# Patient Record
Sex: Male | Born: 1970 | Race: White | Hispanic: No | Smoking: Never smoker
Health system: Southern US, Community
[De-identification: ages and names within clinical notes are randomized; demographics above are authoritative.]

## PROBLEM LIST (undated history)

## (undated) DIAGNOSIS — K219 Gastro-esophageal reflux disease without esophagitis: Secondary | ICD-10-CM

## (undated) DIAGNOSIS — J45909 Unspecified asthma, uncomplicated: Secondary | ICD-10-CM

## (undated) DIAGNOSIS — G4733 Obstructive sleep apnea (adult) (pediatric): Secondary | ICD-10-CM

## (undated) DIAGNOSIS — R7989 Other specified abnormal findings of blood chemistry: Secondary | ICD-10-CM

## (undated) DIAGNOSIS — R251 Tremor, unspecified: Secondary | ICD-10-CM

## (undated) DIAGNOSIS — E119 Type 2 diabetes mellitus without complications: Secondary | ICD-10-CM

## (undated) DIAGNOSIS — F32A Depression, unspecified: Secondary | ICD-10-CM

## (undated) DIAGNOSIS — I10 Essential (primary) hypertension: Secondary | ICD-10-CM

## (undated) DIAGNOSIS — E669 Obesity, unspecified: Secondary | ICD-10-CM

## (undated) DIAGNOSIS — I82409 Acute embolism and thrombosis of unspecified deep veins of unspecified lower extremity: Secondary | ICD-10-CM

## (undated) DIAGNOSIS — F419 Anxiety disorder, unspecified: Secondary | ICD-10-CM

## (undated) DIAGNOSIS — D689 Coagulation defect, unspecified: Secondary | ICD-10-CM

## (undated) DIAGNOSIS — F319 Bipolar disorder, unspecified: Secondary | ICD-10-CM

## (undated) DIAGNOSIS — M48061 Spinal stenosis, lumbar region without neurogenic claudication: Secondary | ICD-10-CM

## (undated) DIAGNOSIS — G4700 Insomnia, unspecified: Secondary | ICD-10-CM

## (undated) DIAGNOSIS — M1990 Unspecified osteoarthritis, unspecified site: Secondary | ICD-10-CM

## (undated) DIAGNOSIS — E559 Vitamin D deficiency, unspecified: Secondary | ICD-10-CM

## (undated) DIAGNOSIS — E785 Hyperlipidemia, unspecified: Secondary | ICD-10-CM

## (undated) DIAGNOSIS — N529 Male erectile dysfunction, unspecified: Secondary | ICD-10-CM

## (undated) DIAGNOSIS — I2699 Other pulmonary embolism without acute cor pulmonale: Secondary | ICD-10-CM

## (undated) HISTORY — DX: Tremor, unspecified: R25.1

## (undated) HISTORY — DX: Essential (primary) hypertension: I10

## (undated) HISTORY — PX: CHOLECYSTECTOMY: SHX55

## (undated) HISTORY — DX: Obesity, unspecified: E66.9

## (undated) HISTORY — DX: Obstructive sleep apnea (adult) (pediatric): G47.33

## (undated) HISTORY — DX: Vitamin D deficiency, unspecified: E55.9

## (undated) HISTORY — PX: NASAL SINUS SURGERY: SHX719

## (undated) HISTORY — DX: Depression, unspecified: F32.A

## (undated) HISTORY — DX: Acute embolism and thrombosis of unspecified deep veins of unspecified lower extremity: I82.409

## (undated) HISTORY — DX: Male erectile dysfunction, unspecified: N52.9

## (undated) HISTORY — DX: Other pulmonary embolism without acute cor pulmonale: I26.99

## (undated) HISTORY — DX: Bipolar disorder, unspecified: F31.9

## (undated) HISTORY — DX: Coagulation defect, unspecified: D68.9

## (undated) HISTORY — DX: Unspecified osteoarthritis, unspecified site: M19.90

## (undated) HISTORY — DX: Other specified abnormal findings of blood chemistry: R79.89

## (undated) HISTORY — DX: Hyperlipidemia, unspecified: E78.5

## (undated) HISTORY — DX: Anxiety disorder, unspecified: F41.9

## (undated) HISTORY — DX: Insomnia, unspecified: G47.00

## (undated) HISTORY — DX: Gastro-esophageal reflux disease without esophagitis: K21.9

## (undated) HISTORY — DX: Spinal stenosis, lumbar region without neurogenic claudication: M48.061

## (undated) HISTORY — PX: COLONOSCOPY: SHX174

## (undated) HISTORY — DX: Unspecified asthma, uncomplicated: J45.909

---

## 2006-02-14 ENCOUNTER — Inpatient Hospital Stay (HOSPITAL_COMMUNITY): Admission: EM | Admit: 2006-02-14 | Discharge: 2006-02-17 | Payer: Self-pay | Admitting: Psychiatry

## 2006-02-14 ENCOUNTER — Ambulatory Visit: Payer: Self-pay | Admitting: Psychiatry

## 2007-01-03 ENCOUNTER — Inpatient Hospital Stay (HOSPITAL_COMMUNITY): Admission: RE | Admit: 2007-01-03 | Discharge: 2007-01-06 | Payer: Self-pay | Admitting: *Deleted

## 2007-01-03 ENCOUNTER — Ambulatory Visit: Payer: Self-pay | Admitting: *Deleted

## 2007-02-10 ENCOUNTER — Inpatient Hospital Stay (HOSPITAL_COMMUNITY): Admission: AD | Admit: 2007-02-10 | Discharge: 2007-02-14 | Payer: Self-pay | Admitting: Psychiatry

## 2007-07-29 ENCOUNTER — Ambulatory Visit: Payer: Self-pay | Admitting: Psychiatry

## 2007-07-29 ENCOUNTER — Inpatient Hospital Stay (HOSPITAL_COMMUNITY): Admission: AD | Admit: 2007-07-29 | Discharge: 2007-08-05 | Payer: Self-pay | Admitting: Psychiatry

## 2010-10-13 NOTE — Discharge Summary (Signed)
NAME:  Tanner Mcgrath, Tanner Mcgrath NO.:  [id number]   MEDICAL RECORD NO.:  [id number]          PATIENT TYPE:  IPS   LOCATION:  0602                          FACILITY:  [facility]   PHYSICIAN:  Jasmine Pang, M.D. DATE OF BIRTH:  1971/04/17   DATE OF ADMISSION:  01/03/2007  DATE OF DISCHARGE:  01/06/2007                               DISCHARGE SUMMARY   IDENTIFYING INFORMATION:  This is a 40 year old separated white male who  was admitted on a voluntary basis on January 03, 2007.   HISTORY OF PRESENT ILLNESS:  The patient admits to being depressed and  has been having thoughts of wanting to die.  He states he wishes he  could go to sleep and never wake up again.  He reportedly is separated  from his spouse and has been having financial difficulties.  He stated  that things have built up and he could not take it anymore.  He has  been feeling overwhelmed and hopeless I hate myself.  He called his  sister to let her know how he was feeling.  He feels worthless and feels  he is making other people miserable.  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  He  reports insomnia and states when he does sleep bad things happen.  He  has a decreased appetite and has lost 30 pounds in the past several  months.  He states he is tired of his life.  He did state that a  deterrent to self-harm is his 48 year old daughter.  The patient was  here one year.  He has no current outpatient treatment.  He was seen  after last admission at [facility] in [place].  He states he did not like  his therapist and did not want to return there.  He states Prozac has  been effective for him in the past.  He denies any family history of  psychiatric problems.  He does have a history of marijuana use and some  recent Fioricet use.  He suffers from migraine headaches.  He is  currently on no medication other than recently he took Fioricet for  headache.  He is  allergic to BUSPAR and TOPAMAX.   PHYSICAL EXAMINATION:  The patient's physical exam was done at the  [facility] ED.  He is a young, healthy though disheveled  male complaining of a headache.  There were no other acute physical  problems noted.   LABORATORY DATA:  Admission laboratories were done at the ED prior to  admission.  Urinalysis was negative except for wbc 7-10 per high power  field.  CBC is within normal limits.  Urine drug screen was positive for  THC and barbiturates.  Other labs were reviewed by the ED physician.   HOSPITAL COURSE:  Upon admission, the patient was started on Ambien 10  mg p.o. q.h.s. p.r.n. insomnia.  He was also started on Ativan 1 mg p.o.  q.6h. p.r.n. anxiety and Prilosec 20 mg p.o. q.d.  He was given a 21 mg  nicotine patch as per  smoking cessation protocol.  On January 04, 2007,  the Ativan was discontinued.  He was started on Motrin 600 mg p.o. q.6h.  p.r.n. headache.  He was started on Zyprexa Zydis 5 mg p.o. q.h.s. and  Zyprexa 5 mg p.o. q.6h. p.r.n. anxiety.  He was also started on Prozac  20 mg daily.  On January 05, 2007, his Zyprexa dose was changed to 7.5 mg  p.o. t.i.d. and the patient tolerated medications well except for some  sedation.  He was initially very anxious though friendly and  cooperative.  He was disheveled with poor eye contact.  He stated he  came to the hospital on my own because he is overwhelmed and hopeless.  He has a lot of social anxiety.  He has difficulty making himself go to  work.  He states he has a history of bipolar disorder.  He has been on  Seroquel and Depakote and Neurontin in the past but states these were  not helpful.  He did say Prozac helped him.  The patient was able to  participate appropriately in unit therapeutic groups and activities.  His mood improved as the hospitalization progressed.  The patient also  talked about his symptoms of inattention and distractibility as well as  other ADHD  symptoms.  He states he has trouble with procrastination and  difficulty completing tasks.  He has been diagnosed in the past and  treated for ADHD.  We discussed the fact that I did not want to start a  stimulant when he was having such severe anxiety but this would be  something he could talk with his outpatient doctor about.  He discussed  his numerous hospitalizations ([address], [place], [place], [facility] one year ago).  On January 06, 2007, the  patient's mental status had improved markedly.  He was friendly and  cooperative with good eye contact.  Speech was normal rate and flow.  He  was no longer disheveled.  His psychomotor activity was within normal  limits.  Mood was euthymic.  Affect wide range.  There was no suicidal  or homicidal ideation.  No thoughts of self-injurious behavior.  No  paranoia or delusions.  No auditory or visual hallucinations.  No  paranoid delusions.  Thoughts were logical and goal-directed.  Thought  content no predominant theme.  Cognitive back to baseline.  It was felt  the patient was safe to be discharged home today.  His sister was going  to pick him up.   DISCHARGE DIAGNOSES:  AXIS I:  Bipolar disorder not otherwise specified.  Attention-deficit hyperactivity disorder not otherwise specified.  AXIS II:  None.  AXIS III:  Migraine headaches.  AXIS IV:  Severe (problems with primary support group, occupational  problems, lack of support, medical problems, burden of psychiatric  illness).   ACTIVITY/DIET:  There were no specific activity level or dietary  restrictions.   FOLLOWUP:  The patient will follow up at [facility]  on January 10, 2007 at 9 a.m. in the morning.   DISCHARGE MEDICATIONS:  1. Prilosec as directed at home.  2. Fluoxetine 40 mg daily.  3. Zyprexa 7.5 mg three times daily.  4. Ambien 10 mg at bedtime as needed.      Jasmine Pang, M.D.  Electronically Signed      BHS/MEDQ  D:  01/06/2007  T:  01/06/2007  Job:  [id number]
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
## 2010-10-13 NOTE — H&P (Signed)
NAME:  Tanner Mcgrath, Tanner Mcgrath NO.:  [id number]   MEDICAL RECORD NO.:  [id number]          PATIENT TYPE:  IPS   LOCATION:  0504                          FACILITY:  [facility]   PHYSICIAN:  Anselm Jungling, MD  DATE OF BIRTH:  [birth year]   DATE OF ADMISSION:  02/10/2007  DATE OF DISCHARGE:                       PSYCHIATRIC ADMISSION ASSESSMENT   IDENTIFYING INFORMATION:  This is a voluntary admission to the services  of Dr. Geralyn Flash.  This is a 40 year old separated white male.  He  presented to the emergency department at [facility] last night.  This was after an intentional overdose of 10 Soma tablets.  He was given  charcoal in the ED.  He was cleared by Poison Control and was allowed to  be admitted to the [facility].  He reports having been  separated for six months.  He reports much conflict with his mother when  he was younger.  He also states that he was begun on Strattera earlier  this week for his ADD as they have to use a nonstimulant first and  this increased his depression, made him feel scared and hence he  overdosed.  He states that he has bad panic, anxiety and paranoia.  Mr.  Herbst was last with [place] about a month ago, January 03, 2007 to January 06, 2007.  At that time, he was noted to be bipolar not otherwise specified,  ADD not otherwise specified and was discharged at that time on Prilosec,  Prozac, Zyprexa and Ambien.   PAST PSYCHIATRIC HISTORY:  He has had admissions to [facility], [facility].  He has been at [facility].  As already stated, he was last  with [place] about a month ago, January 03, 2007 to January 06, 2007, and he was  with [place] last year around this time, February 14, 2006 to February 17, 2006.   SOCIAL HISTORY:  He graduated high school in 1989.  He has been married  once.  He has one daughter, age 61.  He is employed.  He rebuilds torque  converters.   FAMILY HISTORY:  His mother suffered with depression and  anxiety  although she had a diagnosis of bipolar.   ALCOHOL/DRUG HISTORY:  He smokes one pack of cigarettes per day.  He  uses marijuana twice a week.   PRIMARY CARE PHYSICIAN:  He has no primary care physician or  psychiatrist.   MEDICAL PROBLEMS:  He is known to have acid reflux and migraines.   MEDICATIONS:  He reports that he is currently prescribed Soma 350 mg  p.o. t.i.d.  However, since he overdosed on that and it is not  necessary, we will not be reordering that.  Prozac 40 mg p.o. q.d.,  trazodone 12.5 mg t.i.d., Zyprexa 5 mg p.o. q.d., Strattera 25 mg p.o.  q.d., Atarax 25 mg t.i.d. and 25 mg to 50 mg at h.s. as well as 25 mg of  trazodone.   ALLERGIES:  He states that BUSPAR and TOPAMAX cause hives, DEPAKOTE he  cannot remember what happened with it and lithium did  not do any good.   POSITIVE PHYSICAL FINDINGS:  He is a well-developed, well-nourished,  white male who appears his stated age.  He was medically cleared in the  ED at [facility].  His height is 71.5 inches.  His weight is 225 pounds,  his temperature is 97.7, blood pressure is 126/79 to 132/88, pulse is 70  and respirations are 20.  He is status post a cholecystectomy and sinus  surgery.   LABORATORY DATA:  His UDS was positive for benzodiazepines and  marijuana.  He does acknowledge using marijuana twice a week and he is  not prescribed.   MENTAL STATUS EXAM:  Today, he is alert and oriented.  He is  appropriately albeit casually groomed and dressed in shorts and a T-  shirt.  He appears to be adequately nourished.  His speech is not  pressured.  His mood is not as depressed as initially reported in the  ED.  His affect has a range.  Thought processes are clear, rational and  goal-oriented.  He would like to get medication for his ADD.  His  concentration and memory are intact.  Judgment and insight are good.  He  denies being actively suicidal or homicidal at this time.  He denies  having any auditory or  visual hallucinations.  He does report that he  has scary dreams.  He reports that he has panic, anxiety and paranoia.   DIAGNOSES:  AXIS I:  Bipolar disorder not otherwise specified.  Attention-deficit hyperactivity disorder not otherwise specified.  AXIS II:  Probably borderline.  AXIS III:  He reports migraine headaches as well as gastroesophageal  reflux disease.  AXIS IV:  Severe (problems with primary support group, occupational,  lack of support, medical problems, burdens of psychiatric illness).  AXIS V:  45.   PLAN:  To admit for safety and stabilization.  To adjust his medications  as indicated.  I will let Dr. Electa Sniff do that.   ESTIMATED LENGTH OF STAY:  Two to three days and he needs to have follow-  up at [facility].      Mickie Leonarda Salon, P.A.-C.      Anselm Jungling, MD  Electronically Signed    MD/MEDQ  D:  02/11/2007  T:  02/11/2007  Job:  [id number]
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
## 2010-10-16 NOTE — Discharge Summary (Signed)
NAME:  Tanner Mcgrath, Tanner Mcgrath NO.:  [id number]   MEDICAL RECORD NO.:  [id number]          PATIENT TYPE:  IPS   LOCATION:  0504                          FACILITY:  BH   PHYSICIAN:  Anselm Jungling, MD  DATE OF BIRTH:  May 31, 1971   DATE OF ADMISSION:  02/10/2007  DATE OF DISCHARGE:  02/14/2007                               DISCHARGE SUMMARY   IDENTIFYING DATA/REASON FOR ADMISSION:  The patient is a 40 year old  separated white male admitted after an overdose on the muscle relaxant,  Soma.  He reported that he had recently been started on Strattera for  ADHD by Dr. Dorise Hiss, at [facility].  Following this, he  states his mood did a nose dive.  This was his third [facility] admission,  his first having been one year ago.  Please refer to the admission note  for further details pertaining to the symptoms, circumstances and  history that led to his hospitalization.   INITIAL DIAGNOSTIC IMPRESSION:  He was given initial AXIS I diagnosis of  mood disorder not otherwise specified, and cannabis abuse, and history  of attention-deficit hyperactivity disorder.   MEDICAL/LABORATORY:  The patient was medically and physically assessed  by the psychiatric nurse practitioner.  He was continued on his usual  Protonix 40 mg daily for symptoms of GERD.  He also has a history of  migraine headache.  There were no acute medical issues.   HOSPITAL COURSE:  The patient was admitted to the adult inpatient  psychiatric service.  He presented as a well-nourished, well-developed  male who was alert, fully oriented, pleasant and polite.  His mood was  depressed, with grim affect.  His thoughts and speech were normally  organized without any signs or symptoms of psychosis or thought  disorder.  He referred to his overdose as stupid, and denied any  further suicidal ideation, both in the initial interview, and during the  remainder of his stay.  He complained of poor sleep and anxiety.   He  verbalized a strong desire for help.  The patient came to [place] on a  regimen of Prozac, Zyprexa, and Vistaril.  His primary complaint with [place]  was that of anxiety.  He had been using cannabis to self-treat his  anxiety.  As referenced above, Dr. Dorise Hiss had recently diagnosed him  with ADHD, but we determined that ADHD treatment was beyond the scope of  this inpatient stay and his more immediate need for mood stabilization.  We discussed with him a trial of Risperdal to address his anxiety and  sleeplessness.  He agreed to this.  He was continued on Prozac, Zyprexa,  and Risperdal.   The patient reported that he found Risperdal helpful, in initial doses  of 0.25 mg t.i.d. and 1 mg q.h.s.  He was not sedated in the least by  this and stated I've been in a good mood.  Risperdal was subsequently  increased to 0.5 mg t.i.d. and 2 mg q.h.s.  The following day, I  reviewed with the patient and he felt quite stable with respect to sleep  and anxiety.  His mood was positive, he was significantly less  depressed.  He indicated he felt ready for discharge.  He agreed to the  following aftercare plan.   AFTERCARE:  The patient was to follow up at [facility]  with an appointment on the morning following his discharge, February 15, 2007.   DISCHARGE MEDICATIONS:  1. Prozac 40 mg daily.  2. Protonix 40 mg daily.  3. Risperdal 0.5 mg t.i.d. and 2 mg q.h.s.  4. With respect to Zyprexa 5 mg daily and Vistaril 25 mg four times      daily, it was recommended to the patient that he discuss possible      discontinuation of these medications with Dr. Dorise Hiss.   The patient was instructed to discontinue carisoprodol and Strattera.   DISCHARGE DIAGNOSES:  AXIS I:  Mood disorder not otherwise specified.  History of attention-deficit hyperactivity disorder.  AXIS II:  Deferred.  AXIS III:  History of gastroesophageal reflux disease and migraine.  AXIS IV:  Stressors:  Severe.  AXIS V:   GAF on discharge 60.      Anselm Jungling, MD  Electronically Signed     SPB/MEDQ  D:  02/16/2007  T:  02/16/2007  Job:  [phone]

## 2010-10-16 NOTE — H&P (Signed)
NAME:  Tanner Mcgrath, Tanner Mcgrath NO.:  [id number]   MEDICAL RECORD NO.:  [id number]          PATIENT TYPE:  IPS   LOCATION:  0500                          FACILITY:  BH   PHYSICIAN:  Geoffery Lyons, M.D.      DATE OF BIRTH:  04-22-1971   DATE OF ADMISSION:  02/14/2006  DATE OF DISCHARGE:                         PSYCHIATRIC ADMISSION ASSESSMENT   IDENTIFYING INFORMATION:  The patient is a 40 year old separated white male  voluntarily admitted on February 13, 2006.   HISTORY OF PRESENT ILLNESS:  The patient presents with a history of  depression and a suicide attempt by overdosing on Lunesta approximately 8  tablets, Lyrica of an unknown amount and __________ tablets at this sister's  home where he resides.  The patient states his intention was to die.  He  states he was off his antidepressants for approximately three days.  He  states he had an argument with his wife whom he is separated from since  April.  He states that he feels his wife has little sympathy for him, saying  that he is having a pity party which prompted the overdose.  The patient  also is at home with a work injury, off of work at this time.   PAST PSYCHIATRIC HISTORY:  First admission to [facility].  Psychiatrist is Dr. Bonnetta Barry.   SOCIAL HISTORY:  The patient is a 40 year old separated white male, first  marriage, married for 11 years.  Has a 1 year old child and a 6 year old  Museum/gallery conservator.  Works as a Counsellor.  Lives with his sister since  April.  Currently separated from his wife.   FAMILY HISTORY:  Denies.   ALCOHOL/DRUG HISTORY:  The patient smokes a pack a day.  Denies any alcohol  or other drug use.   PRIMARY CARE PHYSICIAN:  Has none currently.  Reports he has some financial  issues with his past primary care Wille Aubuchon and has no current family doctor.   MEDICAL PROBLEMS:  Has a right foot injury that occurred in early September.  He was seen at [facility] for  contusion.  Denies any other acute  or chronic health issues.   MEDICATIONS:  Has been on Effexor 75 mg, 3 daily, hydrocodone 1 tab 5/500 mg  q.i.d. p.r.n., Imodium q.6h. p.r.n., Clindamycin 150 mg, taking 3 p.o.  q.i.d. for 10 days, Prozac 20 mg daily, Reglan 10 mg every eight hours  p.r.n., Valium 5 mg, 1-1/2 tablets p.o. t.i.d., Rozerem 8 mg p.o. q.h.s.  p.r.n., Lunesta 3 mg at bedtime, and Lyrica 75 mg p.o. b.i.d.   ALLERGIES:  TOPAMAX and BUSPAR.  The patient developed a rash.   PHYSICAL EXAMINATION:  The patient was assessed at [facility] after  overdose where he did receive charcoal.  He also had some tattoos noted to  his arm and has some facial piercings.  The patient has an orthopedic shoe  on his right foot with a Band-Aid noted to the top of his right foot.  There  is some mild swelling but no bruising.  There is a small abrasion to  the top  of his foot.  His temperature is 97, heart rate 84, respirations 20, blood  pressure 130/76, 96% saturated.   LABORATORY DATA:  CBC is within normal limits.  Alcohol level less than  0.01.  Potassium 3.3, glucose 143.  Acetaminophen level less than 10.  Salicylate level less than 1.   MENTAL STATUS EXAM:  He is awake, in the bed, cooperative with little eye  contact.  Speech is expansive.  The patient is depressed.  Affect is flat.  Thought processes are coherent.  Cognitive function:  The patient is  distracted.  Memory good.  Judgment poor.  Insight is limited.   DIAGNOSES:  AXIS I:  Bipolar disorder.  AXIS II:  Deferred.  AXIS III:  Right foot injury.  AXIS IV:  Problems with primary support group, psychosocial problems,  medical problems.  AXIS V:  Current 25.   PLAN:  To stabilize mood and thinking.  Contract for safety. Will have  Librium available p.r.n.  The patient is considered a fall risk.  Will  clarify medications.  Will consider a family session with sister.  The  patient is to follow up with psychiatrist in  [place].   TENTATIVE LENGTH OF STAY:  Five to six days.      Landry Corporal, N.P.      Geoffery Lyons, M.D.  Electronically Signed    JO/MEDQ  D:  02/14/2006  T:  02/14/2006  Job:  [id number]

## 2011-03-15 LAB — URINALYSIS, ROUTINE W REFLEX MICROSCOPIC
Nitrite: NEGATIVE
Specific Gravity, Urine: 1.028
pH: 6

## 2011-03-15 LAB — URINE MICROSCOPIC-ADD ON

## 2011-04-13 DIAGNOSIS — G43909 Migraine, unspecified, not intractable, without status migrainosus: Secondary | ICD-10-CM | POA: Insufficient documentation

## 2011-04-13 DIAGNOSIS — F411 Generalized anxiety disorder: Secondary | ICD-10-CM | POA: Diagnosis present

## 2011-04-13 DIAGNOSIS — E1165 Type 2 diabetes mellitus with hyperglycemia: Secondary | ICD-10-CM | POA: Insufficient documentation

## 2011-04-13 DIAGNOSIS — G4700 Insomnia, unspecified: Secondary | ICD-10-CM | POA: Diagnosis present

## 2011-04-13 DIAGNOSIS — K219 Gastro-esophageal reflux disease without esophagitis: Secondary | ICD-10-CM | POA: Diagnosis present

## 2011-04-13 DIAGNOSIS — F4001 Agoraphobia with panic disorder: Secondary | ICD-10-CM | POA: Insufficient documentation

## 2013-08-11 DIAGNOSIS — I2699 Other pulmonary embolism without acute cor pulmonale: Secondary | ICD-10-CM | POA: Insufficient documentation

## 2013-08-12 DIAGNOSIS — I82401 Acute embolism and thrombosis of unspecified deep veins of right lower extremity: Secondary | ICD-10-CM | POA: Insufficient documentation

## 2013-08-12 DIAGNOSIS — J9859 Other diseases of mediastinum, not elsewhere classified: Secondary | ICD-10-CM | POA: Insufficient documentation

## 2013-08-22 DIAGNOSIS — I10 Essential (primary) hypertension: Secondary | ICD-10-CM

## 2014-08-31 DIAGNOSIS — I82402 Acute embolism and thrombosis of unspecified deep veins of left lower extremity: Secondary | ICD-10-CM | POA: Insufficient documentation

## 2014-11-21 DIAGNOSIS — I272 Pulmonary hypertension, unspecified: Secondary | ICD-10-CM | POA: Insufficient documentation

## 2014-11-21 DIAGNOSIS — I8290 Acute embolism and thrombosis of unspecified vein: Secondary | ICD-10-CM | POA: Insufficient documentation

## 2014-12-03 DIAGNOSIS — D509 Iron deficiency anemia, unspecified: Secondary | ICD-10-CM | POA: Diagnosis present

## 2014-12-05 DIAGNOSIS — R791 Abnormal coagulation profile: Secondary | ICD-10-CM | POA: Insufficient documentation

## 2016-07-20 DIAGNOSIS — E669 Obesity, unspecified: Secondary | ICD-10-CM | POA: Diagnosis present

## 2018-05-01 DIAGNOSIS — M1990 Unspecified osteoarthritis, unspecified site: Secondary | ICD-10-CM | POA: Insufficient documentation

## 2018-05-01 DIAGNOSIS — D8989 Other specified disorders involving the immune mechanism, not elsewhere classified: Secondary | ICD-10-CM | POA: Insufficient documentation

## 2018-05-01 DIAGNOSIS — M2550 Pain in unspecified joint: Secondary | ICD-10-CM | POA: Insufficient documentation

## 2019-01-17 DIAGNOSIS — M62838 Other muscle spasm: Secondary | ICD-10-CM | POA: Insufficient documentation

## 2019-01-17 DIAGNOSIS — M48061 Spinal stenosis, lumbar region without neurogenic claudication: Secondary | ICD-10-CM | POA: Insufficient documentation

## 2019-01-17 DIAGNOSIS — G894 Chronic pain syndrome: Secondary | ICD-10-CM | POA: Diagnosis present

## 2019-01-17 DIAGNOSIS — M5442 Lumbago with sciatica, left side: Secondary | ICD-10-CM | POA: Insufficient documentation

## 2019-01-17 DIAGNOSIS — M4310 Spondylolisthesis, site unspecified: Secondary | ICD-10-CM | POA: Insufficient documentation

## 2019-10-04 DIAGNOSIS — R251 Tremor, unspecified: Secondary | ICD-10-CM | POA: Diagnosis present

## 2019-10-04 DIAGNOSIS — R413 Other amnesia: Secondary | ICD-10-CM | POA: Insufficient documentation

## 2020-11-17 DIAGNOSIS — E785 Hyperlipidemia, unspecified: Secondary | ICD-10-CM

## 2021-07-15 ENCOUNTER — Other Ambulatory Visit: Payer: Self-pay

## 2021-07-15 ENCOUNTER — Emergency Department (HOSPITAL_COMMUNITY)
Admission: EM | Admit: 2021-07-15 | Discharge: 2021-07-16 | Disposition: A | Discharge status: Other Acute Inpatient Hospital | Payer: Medicare HMO | Attending: Emergency Medicine | Admitting: Emergency Medicine

## 2021-07-15 DIAGNOSIS — R7309 Other abnormal glucose: Secondary | ICD-10-CM | POA: Insufficient documentation

## 2021-07-15 DIAGNOSIS — F419 Anxiety disorder, unspecified: Secondary | ICD-10-CM | POA: Insufficient documentation

## 2021-07-15 DIAGNOSIS — T50902A Poisoning by unspecified drugs, medicaments and biological substances, intentional self-harm, initial encounter: Secondary | ICD-10-CM

## 2021-07-15 DIAGNOSIS — T426X2A Poisoning by other antiepileptic and sedative-hypnotic drugs, intentional self-harm, initial encounter: Secondary | ICD-10-CM | POA: Insufficient documentation

## 2021-07-15 DIAGNOSIS — R45851 Suicidal ideations: Secondary | ICD-10-CM

## 2021-07-15 DIAGNOSIS — Z20822 Contact with and (suspected) exposure to covid-19: Secondary | ICD-10-CM | POA: Diagnosis not present

## 2021-07-15 DIAGNOSIS — F1994 Other psychoactive substance use, unspecified with psychoactive substance-induced mood disorder: Secondary | ICD-10-CM | POA: Diagnosis not present

## 2021-07-15 DIAGNOSIS — F332 Major depressive disorder, recurrent severe without psychotic features: Secondary | ICD-10-CM | POA: Insufficient documentation

## 2021-07-15 LAB — CBC
HCT: 46.7 % (ref 39.0–52.0)
Hemoglobin: 15.9 g/dL (ref 13.0–17.0)
MCH: 28.6 pg (ref 26.0–34.0)
MCHC: 34 g/dL (ref 30.0–36.0)
MCV: 84.1 fL (ref 80.0–100.0)
Platelets: 221 10*3/uL (ref 150–400)
RBC: 5.55 MIL/uL (ref 4.22–5.81)
RDW: 13.9 % (ref 11.5–15.5)
WBC: 8.6 10*3/uL (ref 4.0–10.5)
nRBC: 0 % (ref 0.0–0.2)

## 2021-07-15 LAB — RAPID URINE DRUG SCREEN, HOSP PERFORMED
Amphetamines: NOT DETECTED
Barbiturates: NOT DETECTED
Benzodiazepines: NOT DETECTED
Cocaine: NOT DETECTED
Opiates: NOT DETECTED
Tetrahydrocannabinol: NOT DETECTED

## 2021-07-15 LAB — ACETAMINOPHEN LEVEL
Acetaminophen (Tylenol), Serum: <10 ug/mL — ABNORMAL LOW (ref 10–30)
Acetaminophen (Tylenol), Serum: <10 ug/mL — ABNORMAL LOW (ref 10–30)

## 2021-07-15 LAB — CBG MONITORING, ED
Glucose-Capillary: 138 mg/dL — ABNORMAL HIGH (ref 70–99)
Glucose-Capillary: 114 mg/dL — ABNORMAL HIGH (ref 70–99)
Glucose-Capillary: 107 mg/dL — ABNORMAL HIGH (ref 70–99)
Glucose-Capillary: 166 mg/dL — ABNORMAL HIGH (ref 70–99)

## 2021-07-15 LAB — COMPREHENSIVE METABOLIC PANEL
ALT: 24 U/L (ref 0–44)
AST: 20 U/L (ref 15–41)
Albumin: 4.3 g/dL (ref 3.5–5.0)
Alkaline Phosphatase: 79 U/L (ref 38–126)
Anion gap: 14 (ref 5–15)
BUN: 18 mg/dL (ref 6–20)
CO2: 19 mmol/L — ABNORMAL LOW (ref 22–32)
Calcium: 9.5 mg/dL (ref 8.9–10.3)
Chloride: 103 mmol/L (ref 98–111)
Creatinine, Ser: 0.71 mg/dL (ref 0.61–1.24)
GFR, Estimated: >60 mL/min (ref >60)
Glucose, Bld: 155 mg/dL — ABNORMAL HIGH (ref 70–99)
Potassium: 3.6 mmol/L (ref 3.5–5.1)
Sodium: 136 mmol/L (ref 135–145)
Total Bilirubin: 0.2 mg/dL — ABNORMAL LOW (ref 0.3–1.2)
Total Protein: 7.9 g/dL (ref 6.5–8.1)

## 2021-07-15 LAB — HEMOGLOBIN A1C
Hgb A1c MFr Bld: 8.2 % — ABNORMAL HIGH (ref 4.8–5.6)
Mean Plasma Glucose: 188.64 mg/dL

## 2021-07-15 LAB — RESP PANEL BY RT-PCR (FLU A&B, COVID) ARPGX2
Influenza A by PCR: NEGATIVE
Influenza B by PCR: NEGATIVE
SARS Coronavirus 2 by RT PCR: NEGATIVE

## 2021-07-15 LAB — SALICYLATE LEVEL: Salicylate Lvl: <7 mg/dL — ABNORMAL LOW (ref 7.0–30.0)

## 2021-07-15 LAB — ETHANOL: Alcohol, Ethyl (B): 18 mg/dL — ABNORMAL HIGH (ref <10)

## 2021-07-15 MED ORDER — GABAPENTIN 100 MG PO CAPS
100.0000 mg | ORAL_CAPSULE | Freq: Two times a day (BID) | ORAL | Status: DC
  Administered 2021-07-15 (×2): 100 mg via ORAL
  Filled 2021-07-15 (×2): qty 1

## 2021-07-15 MED ORDER — DIPHENHYDRAMINE HCL 50 MG/ML IJ SOLN
50.0000 mg | Freq: Once | INTRAMUSCULAR | Status: AC
Start: 2021-07-15 — End: 2021-07-15
  Administered 2021-07-15: 50 mg via INTRAMUSCULAR
  Filled 2021-07-15: qty 1

## 2021-07-15 MED ORDER — AZELASTINE HCL 0.1 % NA SOLN
1.0000 | Freq: Two times a day (BID) | NASAL | Status: DC
  Administered 2021-07-15 (×2): 1 via NASAL
  Filled 2021-07-15: qty 30

## 2021-07-15 MED ORDER — OXYCODONE HCL 5 MG PO TABS
10.0000 mg | ORAL_TABLET | Freq: Four times a day (QID) | ORAL | Status: DC | PRN
  Administered 2021-07-15: 10 mg via ORAL
  Filled 2021-07-15: qty 2

## 2021-07-15 MED ORDER — ATORVASTATIN CALCIUM 40 MG PO TABS
40.0000 mg | ORAL_TABLET | Freq: Every day | ORAL | Status: DC
  Administered 2021-07-15: 40 mg via ORAL
  Filled 2021-07-15: qty 1

## 2021-07-15 MED ORDER — CYCLOBENZAPRINE HCL 10 MG PO TABS
10.0000 mg | ORAL_TABLET | Freq: Three times a day (TID) | ORAL | Status: DC
  Administered 2021-07-15 (×3): 10 mg via ORAL
  Filled 2021-07-15 (×3): qty 1

## 2021-07-15 MED ORDER — TRAZODONE HCL 100 MG PO TABS
200.0000 mg | ORAL_TABLET | Freq: Every day | ORAL | Status: DC
  Administered 2021-07-15: 200 mg via ORAL
  Filled 2021-07-15: qty 2

## 2021-07-15 MED ORDER — FAMOTIDINE 20 MG PO TABS
20.0000 mg | ORAL_TABLET | Freq: Two times a day (BID) | ORAL | Status: DC
  Administered 2021-07-15 (×2): 20 mg via ORAL
  Filled 2021-07-15 (×2): qty 1

## 2021-07-15 MED ORDER — LORAZEPAM 2 MG/ML IJ SOLN
1.0000 mg | Freq: Once | INTRAMUSCULAR | Status: AC
  Administered 2021-07-15: 1 mg via INTRAMUSCULAR
  Filled 2021-07-15: qty 1

## 2021-07-15 MED ORDER — VENLAFAXINE HCL ER 75 MG PO CP24
150.0000 mg | ORAL_CAPSULE | Freq: Every day | ORAL | Status: DC
  Administered 2021-07-15: 150 mg via ORAL
  Filled 2021-07-15: qty 2

## 2021-07-15 MED ORDER — RIVAROXABAN 20 MG PO TABS
20.0000 mg | ORAL_TABLET | Freq: Every day | ORAL | Status: DC
  Administered 2021-07-15: 20 mg via ORAL
  Filled 2021-07-15 (×2): qty 1

## 2021-07-15 MED ORDER — HYDROXYZINE HCL 25 MG PO TABS
25.0000 mg | ORAL_TABLET | Freq: Once | ORAL | Status: AC
  Administered 2021-07-15: 25 mg via ORAL
  Filled 2021-07-15: qty 1

## 2021-07-15 MED ORDER — LORAZEPAM 2 MG/ML IJ SOLN
1.0000 mg | Freq: Once | INTRAMUSCULAR | Status: AC
Start: 2021-07-15 — End: 2021-07-15

## 2021-07-15 MED ORDER — DOXEPIN HCL 25 MG PO CAPS
25.0000 mg | ORAL_CAPSULE | Freq: Every day | ORAL | Status: DC
  Administered 2021-07-15: 25 mg via ORAL
  Filled 2021-07-15 (×2): qty 1

## 2021-07-15 MED ORDER — ACETAMINOPHEN 325 MG PO TABS
650.0000 mg | ORAL_TABLET | Freq: Once | ORAL | Status: AC
  Administered 2021-07-15: 650 mg via ORAL
  Filled 2021-07-15: qty 2

## 2021-07-15 MED ORDER — ZIPRASIDONE MESYLATE 20 MG IM SOLR
20.0000 mg | Freq: Once | INTRAMUSCULAR | Status: AC
Start: 2021-07-15 — End: 2021-07-15
  Administered 2021-07-15: 20 mg via INTRAMUSCULAR
  Filled 2021-07-15: qty 20

## 2021-07-15 MED ORDER — HYDROXYZINE HCL 25 MG PO TABS
50.0000 mg | ORAL_TABLET | Freq: Two times a day (BID) | ORAL | Status: DC | PRN
  Filled 2021-07-15: qty 2

## 2021-07-15 MED ORDER — LORAZEPAM 2 MG/ML IJ SOLN
INTRAMUSCULAR | Status: AC
  Administered 2021-07-15: 1 mg via INTRAVENOUS
  Filled 2021-07-15: qty 1

## 2021-07-15 MED ORDER — INSULIN ASPART 100 UNIT/ML IJ SOLN
0.0000 [IU] | Freq: Three times a day (TID) | INTRAMUSCULAR | Status: DC
  Administered 2021-07-15: 2 [IU] via SUBCUTANEOUS
  Filled 2021-07-15: qty 0.15

## 2021-07-15 MED ORDER — QUETIAPINE FUMARATE 25 MG PO TABS
25.0000 mg | ORAL_TABLET | Freq: Every day | ORAL | Status: DC
Start: 2021-07-15 — End: 2021-07-16
  Administered 2021-07-15: 25 mg via ORAL
  Filled 2021-07-15: qty 1

## 2021-07-15 MED ORDER — PANTOPRAZOLE SODIUM 40 MG PO TBEC
40.0000 mg | DELAYED_RELEASE_TABLET | Freq: Every day | ORAL | Status: DC
  Administered 2021-07-15: 40 mg via ORAL
  Filled 2021-07-15: qty 1

## 2021-07-15 MED ORDER — STERILE WATER FOR INJECTION IJ SOLN
INTRAMUSCULAR | Status: AC
  Administered 2021-07-15: 1.2 mL
  Filled 2021-07-15: qty 10

## 2021-07-15 MED ORDER — PROPRANOLOL HCL ER 60 MG PO CP24
60.0000 mg | ORAL_CAPSULE | Freq: Every day | ORAL | Status: DC
  Administered 2021-07-15: 60 mg via ORAL
  Filled 2021-07-15: qty 1

## 2021-07-15 NOTE — ED Notes (Signed)
Pt belongings removed and placed in cabinet for Hall [facility], pt has 1 bag labeled with his name Changed into burgundy scrubs Has been wanded by security Pt has a pair of metal rimmed glasses that remain on his person.

## 2021-07-15 NOTE — ED Notes (Addendum)
Writer standing at fax machine was made aware by registration staff that patient was banging to head against a recliner by his bed.  Patient seen doing just that, however it looked very much like attention seeking behavior. Hailey, NT took recliner away from the patient and he became irate, yelling and cursing in the hall.  Patient yelled we were talking all his rights away.  Due to his behavior security called to area, patient then threw a cup of ice water and started walking away to exit the EMS bay.  Security again called and get able to stop him with [employer] as well. Patient moved to room 27 and given injections by Ronalee Belts, RN that ordered by Long, EDP. Ronalee Belts, RN gave verbal report to [place], RN in [place]. Security and [employer] to stay present until patient calms.

## 2021-07-15 NOTE — BH Assessment (Addendum)
@  0912, requested patient's nurse Jenny Reichmann, RN) to set up the TTS machine for patient's initial assessment. Awaiting response.  @0915 , patient's nurse stated he will locate a room for patient and let me know when patient is ready.

## 2021-07-15 NOTE — ED Notes (Signed)
Pt behavior is escalating. He attempted to leave. Security notified, pt back in bed now. New orders received.

## 2021-07-15 NOTE — Progress Notes (Signed)
07/15/2021  1830  Patient is awake now. Pt is calm and eating dinner. Pt is aware that we are looking for inpatient placement.

## 2021-07-15 NOTE — ED Provider Notes (Signed)
°  Care of patient assumed from Tanner Mcgrath at [phone].  Agree with history, physical exam and plan.  See their note for further details. Briefly, 51 year old male with a history of anxiety, depression, chronic back pain, and obesity presents to the emergency department with a chief plaint of overdose.  Patient took at most 10 tablets of his Lunesta at 15 PM yesterday.  States that he "just wanted to go to sleep."  Patient has been feeling very depressed and having some thoughts of suicide at that time.  Physical Exam  BP (!) 144/90    Pulse (!) 102    Temp 97.8 F (36.6 C) (Oral)    Resp 19    SpO2 96%   Physical Exam Vitals and nursing note reviewed.  Constitutional:      General: He is not in acute distress.    Appearance: He is not ill-appearing, toxic-appearing or diaphoretic.  HENT:     Head: Normocephalic.  Eyes:     General: No scleral icterus.       Right eye: No discharge.        Left eye: No discharge.  Cardiovascular:     Rate and Rhythm: Normal rate.  Pulmonary:     Effort: Pulmonary effort is normal.  Skin:    General: Skin is warm and dry.  Neurological:     General: No focal deficit present.     Mental Status: He is alert.     GCS: GCS eye subscore is 4. GCS verbal subscore is 5. GCS motor subscore is 6.  Psychiatric:        Behavior: Behavior is cooperative.    Procedures  Procedures  ED Course / MDM    Medical Decision Making Amount and/or Complexity of Data Reviewed Labs: ordered.  Risk OTC drugs. Prescription drug management.   Poison control was contacted by previous provider.  They recommended 4-hour observation Tylenol level.  If Tylenol level within normal limits then patient not having any somnolence from Lunesta and Bumex.  Home medications ordered by previous provider.  At time of handoff Tylenol level pending.  Plan for TTS consult once medically cleared.  Patient is under IVC  Tylenol level within normal limits.  Patient is alert and  mentating well.  No somnolence or altered mental status.  Medically cleared at this time.  Patient does report feeling anxious will put an order for Atarax at this time.   Per [facility], DNP, patient meets criteria for inpatient psychiatric treatment     Berneice Heinrich 07/15/21 1241    Wynetta Fines, MD 07/15/21 1501

## 2021-07-15 NOTE — ED Notes (Signed)
Pt was banging his head on the wall, had to be asked by staff members to stop. Now he is sitting on the side of the bed, anxious, but cooperative.

## 2021-07-15 NOTE — Progress Notes (Signed)
07/15/2021  1620  Patient has been calm and sleeping since 1230.

## 2021-07-15 NOTE — ED Provider Notes (Signed)
[facility]EMERGENCY DEPT Provider Note   CSN: [id number] Arrival date & time: 07/15/21  0150     History  Chief Complaint  Patient presents with   Suicidal    Tanner Mcgrath is a 51 y.o. male.  HPI  51 year old male with a history of anxiety/depression, chronic back pain, obesity, who presents to the emergency department today for evaluation of an overdose.  Patient states that he took an overdose of his Lunesta.  He estimates that he took at most 10 tablets of his Lunesta.  He states he did this at 9 PM.  He states that he "just wanted to go to sleep ".  He had been feeling very depressed and was having some thoughts of suicide at the time.  He feels like his Effexor has not been working lately.  He also admits to drinking a sixpack of beer tonight.  He does not drink daily.  He denies any other drug use and denies any other coingestions.  Of note, per EMS report the patient's significant other noted that he took 28 tablets of his Lunesta and this was done at 1:30 AM.  2:53 PM Discussed case with the patients significant other, Bradly Chris. She states she went to bed around 8:30 PM. She states he was acting abnormal and then multiple friends started calling his and her phones concerned about a post he had put on facebook that suggested he had harmed himself. Pt admitted to her that he took too many of his Lunesta last night. She states his recently filled bottle was completely empty. She estimates that he took the medication around 930pm  Home Medications Prior to Admission medications   Not on File      Allergies    Patient has no allergy information on record.    Review of Systems   Review of Systems See HPI for pertinent positives or negatives.   Physical Exam Updated Vital Signs BP (!) 144/90    Pulse (!) 102    Temp 97.8 F (36.6 C) (Oral)    Resp 19    SpO2 96%  Physical Exam Vitals and nursing note reviewed.  Constitutional:      General: He is not  in acute distress.    Appearance: He is well-developed.  HENT:     Head: Normocephalic and atraumatic.  Eyes:     Conjunctiva/sclera: Conjunctivae normal.  Cardiovascular:     Rate and Rhythm: Normal rate and regular rhythm.     Heart sounds: Normal heart sounds. No murmur heard. Pulmonary:     Effort: Pulmonary effort is normal. No respiratory distress.     Breath sounds: Normal breath sounds. No wheezing, rhonchi or rales.  Abdominal:     General: Bowel sounds are normal.     Palpations: Abdomen is soft.     Tenderness: There is no abdominal tenderness. There is no guarding or rebound.  Musculoskeletal:        General: No swelling.     Cervical back: Neck supple.  Skin:    General: Skin is warm and dry.     Capillary Refill: Capillary refill takes less than 2 seconds.  Neurological:     Mental Status: He is alert.     Comments: Normal mentation  Psychiatric:        Mood and Affect: Mood is depressed. Affect is flat.        Speech: Speech is delayed.        Behavior: Behavior is  withdrawn.        Thought Content: Thought content includes suicidal ideation. Thought content does not include homicidal ideation. Thought content does not include homicidal plan.        Judgment: Judgment is impulsive.     ED Results / Procedures / Treatments   Labs (all labs ordered are listed, but only abnormal results are displayed) Labs Reviewed  COMPREHENSIVE METABOLIC PANEL - Abnormal; Notable for the following components:      Result Value   CO2 19 (*)    Glucose, Bld 155 (*)    Total Bilirubin 0.2 (*)    All other components within normal limits  ETHANOL - Abnormal; Notable for the following components:   Alcohol, Ethyl (B) 18 (*)    All other components within normal limits  SALICYLATE LEVEL - Abnormal; Notable for the following components:   Salicylate Lvl <7.0 (*)    All other components within normal limits  ACETAMINOPHEN LEVEL - Abnormal; Notable for the following components:    Acetaminophen (Tylenol), Serum <10 (*)    All other components within normal limits  RESP PANEL BY RT-PCR (FLU A&B, COVID) ARPGX2  CBC  RAPID URINE DRUG SCREEN, HOSP PERFORMED  ACETAMINOPHEN LEVEL  HEMOGLOBIN A1C    EKG EKG Interpretation  Date/Time:  Wednesday July 15 2021 02:05:56 EST Ventricular Rate:  111 PR Interval:  154 QRS Duration: 82 QT Interval:  332 QTC Calculation: 451 R Axis:   79 Text Interpretation: Sinus tachycardia Otherwise normal ECG No previous ECGs available Confirmed by Ross Marcus ([id number]) on 07/15/2021 2:08:43 AM  Radiology No results found.  Procedures .Critical Care Performed by: Karrie Meres, PA-C Authorized by: Karrie Meres, PA-C   Critical care provider statement:    Critical care time (minutes):  40   Critical care time was exclusive of:  Separately billable procedures and treating other patients   Critical care was necessary to treat or prevent imminent or life-threatening deterioration of the following conditions:  Toxidrome   Critical care was time spent personally by me on the following activities:  Development of treatment plan with patient or surrogate, discussions with consultants, evaluation of patient's response to treatment, examination of patient, ordering and review of laboratory studies, ordering and review of radiographic studies, ordering and performing treatments and interventions, pulse oximetry, re-evaluation of patient's condition and review of old charts   I assumed direction of critical care for this patient from another provider in my specialty: no     Care discussed with: admitting provider      Medications Ordered in ED Medications  atorvastatin (LIPITOR) tablet 40 mg (has no administration in time range)  azelastine (ASTELIN) 0.1 % nasal spray 1 spray (has no administration in time range)  cyclobenzaprine (FLEXERIL) tablet 10 mg (has no administration in time range)  doxepin (SINEQUAN) capsule 25 mg  (has no administration in time range)  famotidine (PEPCID) tablet 20 mg (has no administration in time range)  hydrOXYzine (ATARAX) tablet 50 mg (has no administration in time range)  pantoprazole (PROTONIX) EC tablet 40 mg (has no administration in time range)  oxyCODONE (Oxy IR/ROXICODONE) immediate release tablet 10 mg (has no administration in time range)  gabapentin (NEURONTIN) capsule 100 mg (has no administration in time range)  propranolol ER (INDERAL LA) 24 hr capsule 60 mg (has no administration in time range)  QUEtiapine (SEROQUEL) tablet 25 mg (has no administration in time range)  rivaroxaban (XARELTO) tablet 20 mg (has no administration in time range)  traZODone (DESYREL) tablet 200 mg (has no administration in time range)  insulin aspart (novoLOG) injection 0-15 Units (has no administration in time range)  venlafaxine XR (EFFEXOR-XR) 24 hr capsule 150 mg (has no administration in time range)  acetaminophen (TYLENOL) tablet 650 mg (650 mg Oral Given 07/15/21 0527)  LORazepam (ATIVAN) injection 1 mg (1 mg Intravenous Given 07/15/21 0814)    ED Course/ Medical Decision Making/ A&P                           Medical Decision Making Amount and/or Complexity of Data Reviewed Labs: ordered.  Risk OTC drugs. Prescription drug management.   This patient presents to the ED for concern of overdose, this involves an extensive number of treatment options, and is a complaint that carries with it a high risk of complications and morbidity.  The differential diagnosis includes but is not limited to coingestion with other medications, drug use, etoh use, neurologic    Comorbidities that complicate the patient evaluation: Patients presentation is complicated by their history of anxiety/depression  Social Determinants of Health: Patients  lack of access to therapy   increases the complexity of managing their presentation  Additional history obtained: Additional history obtained from  significant other and EMS  Records reviewed Care Everywhere/External Records  Lab Tests: I Ordered, and personally interpreted labs.  The pertinent results include:   Cbc wnl Cmp with mildly low bicarb Etoh mildly elevated Acetaminophen and salicylate negative Covid/flu negative Uds negative  Medicines ordered and prescription drug management: I ordered medication including ativan  for agitation  Reevaluation of the patient after these medicines showed that the patient    improved  Consultations Obtained: 2:28 AM I consulted with the consultant poison control , and discussed  findings as well as pertinent plan - they recommend: 4 hour tylenol level. Minimum of 4 hour observation and asymptomatic.   Complexity of problems addressed: Patients presentation is most consistent with  acute presentation with potential threat to life or bodily function  Disposition: pending w/u  Care transitioned to Francine Graven, [facility] with plan to f/u on pending tylenol level. If negative and pt continues to be asymptomatic then he will be stable for medical clearance and TTS eval. He is under IVC.   Final Clinical Impression(s) / ED Diagnoses Final diagnoses:  Suicidal ideation  Intentional overdose, initial encounter [facility])    Rx / DC Orders ED Discharge Orders     None         Karrie Meres, PA-C 07/15/21 4818    Shon Baton, MD 07/19/21 [phone]

## 2021-07-15 NOTE — ED Triage Notes (Addendum)
Pt BIB EMS with reports of suicide. Pt states that he took 83 Lunesta tablets 30 minutes ago. Pt states that he just wanted to sleep and is no longer suicidal.   160/90 100 hr 100 % room air

## 2021-07-15 NOTE — BH Assessment (Signed)
[facility] Assessment Progress Note   Per Caryn Bee, NP this pt requires psychiatric hospitalization at this time.  Pt presents under IVC initiated by pt's girlfriend and upheld by EDP Ross Marcus, MD.  Pt is currently under review at [facility].  Final disposition is pending as of this writing.  Doylene Canning, [place] [facility] Coordinator [phone]

## 2021-07-15 NOTE — ED Provider Notes (Signed)
Blood pressure (!) 144/90, pulse (!) 102, temperature 97.8 F (36.6 C), temperature source Oral, resp. rate 19, SpO2 96 %.   In short, Tanner Mcgrath is a 51 y.o. male with a chief complaint of Suicidal and Psychiatric Evaluation .  Refer to the original H&P for additional details.  11:10 AM Called for assistance by nursing staff.  Patient had been in hallway B and is under IVC.  He suddenly became more agitated and began running from his hallway bed toward the exit.  He was stopped by staff and security.  He was shouting and upset.  Difficult to redirect.  He was returned to his bed.  Will give IM medication to treat acute agitation. EKG from this AM shows QTc within normal limits. Will need monitored bed after medication.   CRITICAL CARE Performed by: Margette Fast Total critical care time: 35 minutes Critical care time was exclusive of separately billable procedures and treating other patients. Critical care was necessary to treat or prevent imminent or life-threatening deterioration. Critical care was time spent personally by me on the following activities: development of treatment plan with patient and/or surrogate as well as nursing, discussions with consultants, evaluation of patient's response to treatment, examination of patient, obtaining history from patient or surrogate, ordering and performing treatments and interventions, ordering and review of laboratory studies, ordering and review of radiographic studies, pulse oximetry and re-evaluation of patient's condition.  Nanda Quinton, MD Emergency Medicine    Yeraldin Litzenberger, [facility], MD 07/15/21 1116
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
## 2021-07-15 NOTE — ED Notes (Signed)
Poison control recommends given activated charcoal and getting another Tylenol level at 0530. Minimum 4 hr observation.

## 2021-07-15 NOTE — Progress Notes (Signed)
Pt was accepted to [facility] 07/15/2021 at 2200; Bed Assignment 401-2.  Pt meets inpatient criteria per Louanne Skye, DNP  Attending Physician will be Dr. Viann Fish  Report can be called to: Adult unit: [phone]  Pt can arrive after 2200  Nursing notified: Sheran Fava, FNP, Arlana Pouch, RN   Nadara Mode, [place] 07/15/2021 @ 10:17 PM

## 2021-07-16 ENCOUNTER — Inpatient Hospital Stay (HOSPITAL_COMMUNITY)
Admission: AD | Admit: 2021-07-16 | Discharge: 2021-07-22 | DRG: 885 | Disposition: A | Discharge status: Home / Self Care | Payer: Medicare HMO | Source: Intra-hospital | Attending: Psychiatry | Admitting: Psychiatry

## 2021-07-16 ENCOUNTER — Encounter (HOSPITAL_COMMUNITY): Payer: Self-pay | Admitting: Family

## 2021-07-16 DIAGNOSIS — Z794 Long term (current) use of insulin: Secondary | ICD-10-CM | POA: Diagnosis not present

## 2021-07-16 DIAGNOSIS — K5909 Other constipation: Secondary | ICD-10-CM | POA: Diagnosis present

## 2021-07-16 DIAGNOSIS — G43909 Migraine, unspecified, not intractable, without status migrainosus: Secondary | ICD-10-CM | POA: Diagnosis present

## 2021-07-16 DIAGNOSIS — Z7984 Long term (current) use of oral hypoglycemic drugs: Secondary | ICD-10-CM | POA: Diagnosis not present

## 2021-07-16 DIAGNOSIS — F314 Bipolar disorder, current episode depressed, severe, without psychotic features: Secondary | ICD-10-CM | POA: Diagnosis not present

## 2021-07-16 DIAGNOSIS — Z9151 Personal history of suicidal behavior: Secondary | ICD-10-CM | POA: Diagnosis not present

## 2021-07-16 DIAGNOSIS — G894 Chronic pain syndrome: Secondary | ICD-10-CM | POA: Diagnosis present

## 2021-07-16 DIAGNOSIS — G4733 Obstructive sleep apnea (adult) (pediatric): Secondary | ICD-10-CM | POA: Diagnosis present

## 2021-07-16 DIAGNOSIS — F332 Major depressive disorder, recurrent severe without psychotic features: Secondary | ICD-10-CM | POA: Diagnosis present

## 2021-07-16 DIAGNOSIS — Z888 Allergy status to other drugs, medicaments and biological substances status: Secondary | ICD-10-CM

## 2021-07-16 DIAGNOSIS — Z87891 Personal history of nicotine dependence: Secondary | ICD-10-CM | POA: Diagnosis not present

## 2021-07-16 DIAGNOSIS — G4700 Insomnia, unspecified: Secondary | ICD-10-CM | POA: Diagnosis present

## 2021-07-16 DIAGNOSIS — Z818 Family history of other mental and behavioral disorders: Secondary | ICD-10-CM

## 2021-07-16 DIAGNOSIS — Z7985 Long-term (current) use of injectable non-insulin antidiabetic drugs: Secondary | ICD-10-CM

## 2021-07-16 DIAGNOSIS — F411 Generalized anxiety disorder: Secondary | ICD-10-CM | POA: Diagnosis present

## 2021-07-16 DIAGNOSIS — E669 Obesity, unspecified: Secondary | ICD-10-CM | POA: Diagnosis present

## 2021-07-16 DIAGNOSIS — I10 Essential (primary) hypertension: Secondary | ICD-10-CM | POA: Diagnosis present

## 2021-07-16 DIAGNOSIS — F3130 Bipolar disorder, current episode depressed, mild or moderate severity, unspecified: Principal | ICD-10-CM | POA: Diagnosis present

## 2021-07-16 DIAGNOSIS — D509 Iron deficiency anemia, unspecified: Secondary | ICD-10-CM | POA: Diagnosis present

## 2021-07-16 DIAGNOSIS — J45909 Unspecified asthma, uncomplicated: Secondary | ICD-10-CM | POA: Diagnosis present

## 2021-07-16 DIAGNOSIS — G2581 Restless legs syndrome: Secondary | ICD-10-CM | POA: Diagnosis present

## 2021-07-16 DIAGNOSIS — Z635 Disruption of family by separation and divorce: Secondary | ICD-10-CM

## 2021-07-16 DIAGNOSIS — E785 Hyperlipidemia, unspecified: Secondary | ICD-10-CM | POA: Diagnosis present

## 2021-07-16 DIAGNOSIS — Z79899 Other long term (current) drug therapy: Secondary | ICD-10-CM

## 2021-07-16 DIAGNOSIS — K219 Gastro-esophageal reflux disease without esophagitis: Secondary | ICD-10-CM | POA: Diagnosis present

## 2021-07-16 DIAGNOSIS — E119 Type 2 diabetes mellitus without complications: Secondary | ICD-10-CM | POA: Diagnosis present

## 2021-07-16 DIAGNOSIS — R251 Tremor, unspecified: Secondary | ICD-10-CM | POA: Diagnosis present

## 2021-07-16 HISTORY — DX: Type 2 diabetes mellitus without complications: E11.9

## 2021-07-16 LAB — GLUCOSE, CAPILLARY
Glucose-Capillary: 107 mg/dL — ABNORMAL HIGH (ref 70–99)
Glucose-Capillary: 158 mg/dL — ABNORMAL HIGH (ref 70–99)
Glucose-Capillary: 155 mg/dL — ABNORMAL HIGH (ref 70–99)

## 2021-07-16 LAB — LIPID PANEL
Cholesterol: 144 mg/dL (ref 0–200)
HDL: 39 mg/dL — ABNORMAL LOW (ref >40)
LDL Cholesterol: 65 mg/dL (ref 0–99)
Total CHOL/HDL Ratio: 3.7 RATIO
Triglycerides: 202 mg/dL — ABNORMAL HIGH (ref <150)
VLDL: 40 mg/dL (ref 0–40)

## 2021-07-16 LAB — TSH: TSH: 2.576 u[IU]/mL (ref 0.350–4.500)

## 2021-07-16 LAB — HEMOGLOBIN A1C
Hgb A1c MFr Bld: 7.9 % — ABNORMAL HIGH (ref 4.8–5.6)
Mean Plasma Glucose: 180.03 mg/dL

## 2021-07-16 MED ORDER — QUETIAPINE FUMARATE 25 MG PO TABS
25.0000 mg | ORAL_TABLET | Freq: Every day | ORAL | Status: DC
  Filled 2021-07-16 (×2): qty 1

## 2021-07-16 MED ORDER — VENLAFAXINE HCL ER 37.5 MG PO CP24
37.5000 mg | ORAL_CAPSULE | Freq: Every day | ORAL | Status: AC
  Administered 2021-07-20 – 2021-07-21 (×2): 37.5 mg via ORAL
  Filled 2021-07-16 (×3): qty 1

## 2021-07-16 MED ORDER — VENLAFAXINE HCL ER 75 MG PO CP24: 75.0000 mg | ORAL_CAPSULE | Freq: Every day | ORAL | Status: DC

## 2021-07-16 MED ORDER — ONDANSETRON 4 MG PO TBDP: 4.0000 mg | ORAL_TABLET | Freq: Four times a day (QID) | ORAL | Status: AC | PRN

## 2021-07-16 MED ORDER — ZIPRASIDONE MESYLATE 20 MG IM SOLR: 20.0000 mg | INTRAMUSCULAR | Status: DC | PRN

## 2021-07-16 MED ORDER — VENLAFAXINE HCL ER 37.5 MG PO CP24: 37.5000 mg | ORAL_CAPSULE | Freq: Every day | ORAL | Status: DC

## 2021-07-16 MED ORDER — RIVAROXABAN 20 MG PO TABS
20.0000 mg | ORAL_TABLET | Freq: Every day | ORAL | Status: DC
  Administered 2021-07-16 – 2021-07-22 (×7): 20 mg via ORAL
  Filled 2021-07-16 (×9): qty 1

## 2021-07-16 MED ORDER — DULOXETINE HCL 30 MG PO CPEP
30.0000 mg | ORAL_CAPSULE | Freq: Every day | ORAL | Status: DC
  Filled 2021-07-16 (×2): qty 1

## 2021-07-16 MED ORDER — ALUM & MAG HYDROXIDE-SIMETH 200-200-20 MG/5ML PO SUSP: 30.0000 mL | ORAL | Status: DC | PRN

## 2021-07-16 MED ORDER — HYDROCERIN EX CREA
TOPICAL_CREAM | Freq: Two times a day (BID) | CUTANEOUS | Status: DC
  Administered 2021-07-16 – 2021-07-22 (×4): 1 via TOPICAL
  Filled 2021-07-16 (×2): qty 113

## 2021-07-16 MED ORDER — DULOXETINE HCL 30 MG PO CPEP
30.0000 mg | ORAL_CAPSULE | Freq: Every day | ORAL | Status: AC
  Administered 2021-07-17: 30 mg via ORAL
  Filled 2021-07-16: qty 1

## 2021-07-16 MED ORDER — MAGNESIUM HYDROXIDE 400 MG/5ML PO SUSP: 30.0000 mL | Freq: Every day | ORAL | Status: DC | PRN

## 2021-07-16 MED ORDER — TRAZODONE HCL 100 MG PO TABS
200.0000 mg | ORAL_TABLET | Freq: Every day | ORAL | Status: DC
  Filled 2021-07-16 (×2): qty 2

## 2021-07-16 MED ORDER — LOPERAMIDE HCL 2 MG PO CAPS: 2.0000 mg | ORAL_CAPSULE | ORAL | Status: AC | PRN

## 2021-07-16 MED ORDER — DULOXETINE HCL 30 MG PO CPEP
30.0000 mg | ORAL_CAPSULE | Freq: Once | ORAL | Status: AC
  Administered 2021-07-16: 30 mg via ORAL
  Filled 2021-07-16 (×2): qty 1

## 2021-07-16 MED ORDER — MELATONIN 3 MG PO TABS: 3.0000 mg | ORAL_TABLET | Freq: Every day | ORAL | Status: DC

## 2021-07-16 MED ORDER — PANTOPRAZOLE SODIUM 40 MG PO TBEC
40.0000 mg | DELAYED_RELEASE_TABLET | Freq: Every day | ORAL | Status: DC
  Administered 2021-07-16 – 2021-07-22 (×7): 40 mg via ORAL
  Filled 2021-07-16 (×9): qty 1

## 2021-07-16 MED ORDER — VENLAFAXINE HCL ER 75 MG PO CP24
75.0000 mg | ORAL_CAPSULE | Freq: Every day | ORAL | Status: DC
  Filled 2021-07-16: qty 1

## 2021-07-16 MED ORDER — TRAZODONE HCL 100 MG PO TABS
200.0000 mg | ORAL_TABLET | Freq: Every evening | ORAL | Status: DC | PRN
  Administered 2021-07-16 – 2021-07-21 (×5): 200 mg via ORAL
  Filled 2021-07-16 (×4): qty 2

## 2021-07-16 MED ORDER — GABAPENTIN 100 MG PO CAPS
100.0000 mg | ORAL_CAPSULE | Freq: Two times a day (BID) | ORAL | Status: DC
  Administered 2021-07-16: 100 mg via ORAL
  Filled 2021-07-16 (×5): qty 1

## 2021-07-16 MED ORDER — VENLAFAXINE HCL ER 150 MG PO CP24
150.0000 mg | ORAL_CAPSULE | Freq: Every day | ORAL | Status: AC
  Administered 2021-07-17: 150 mg via ORAL
  Filled 2021-07-16: qty 1

## 2021-07-16 MED ORDER — CYCLOBENZAPRINE HCL 10 MG PO TABS
10.0000 mg | ORAL_TABLET | Freq: Three times a day (TID) | ORAL | Status: DC
  Administered 2021-07-16 (×2): 10 mg via ORAL
  Filled 2021-07-16 (×7): qty 1

## 2021-07-16 MED ORDER — LORAZEPAM 1 MG PO TABS: 1.0000 mg | ORAL_TABLET | ORAL | Status: DC | PRN

## 2021-07-16 MED ORDER — PREGABALIN 75 MG PO CAPS
150.0000 mg | ORAL_CAPSULE | Freq: Two times a day (BID) | ORAL | Status: DC
  Administered 2021-07-16 – 2021-07-22 (×12): 150 mg via ORAL
  Filled 2021-07-16 (×12): qty 2

## 2021-07-16 MED ORDER — ATORVASTATIN CALCIUM 40 MG PO TABS
40.0000 mg | ORAL_TABLET | Freq: Every day | ORAL | Status: DC
  Administered 2021-07-16 – 2021-07-22 (×7): 40 mg via ORAL
  Filled 2021-07-16 (×9): qty 1

## 2021-07-16 MED ORDER — OXYCODONE HCL 5 MG PO TABS
10.0000 mg | ORAL_TABLET | Freq: Four times a day (QID) | ORAL | Status: DC | PRN
  Administered 2021-07-16 (×2): 10 mg via ORAL
  Filled 2021-07-16 (×2): qty 2

## 2021-07-16 MED ORDER — ADULT MULTIVITAMIN W/MINERALS CH
1.0000 | ORAL_TABLET | Freq: Every day | ORAL | Status: DC
  Administered 2021-07-16 – 2021-07-22 (×7): 1 via ORAL
  Filled 2021-07-16 (×10): qty 1

## 2021-07-16 MED ORDER — HYDROCORTISONE 1 % EX CREA: TOPICAL_CREAM | Freq: Two times a day (BID) | CUTANEOUS | Status: DC | PRN

## 2021-07-16 MED ORDER — AZELASTINE HCL 0.1 % NA SOLN
1.0000 | Freq: Two times a day (BID) | NASAL | Status: DC
  Administered 2021-07-16 – 2021-07-22 (×6): 1 via NASAL

## 2021-07-16 MED ORDER — THIAMINE HCL 100 MG PO TABS
100.0000 mg | ORAL_TABLET | Freq: Every day | ORAL | Status: DC
  Administered 2021-07-17 – 2021-07-22 (×6): 100 mg via ORAL
  Filled 2021-07-16 (×8): qty 1

## 2021-07-16 MED ORDER — DULOXETINE HCL 60 MG PO CPEP
60.0000 mg | ORAL_CAPSULE | Freq: Every day | ORAL | Status: DC
  Administered 2021-07-18 – 2021-07-22 (×5): 60 mg via ORAL
  Filled 2021-07-16 (×7): qty 1

## 2021-07-16 MED ORDER — VENLAFAXINE HCL ER 150 MG PO CP24
150.0000 mg | ORAL_CAPSULE | Freq: Every day | ORAL | Status: DC
  Filled 2021-07-16 (×2): qty 1

## 2021-07-16 MED ORDER — MELATONIN 3 MG PO TABS
6.0000 mg | ORAL_TABLET | Freq: Every day | ORAL | Status: DC
  Administered 2021-07-16 – 2021-07-21 (×6): 6 mg via ORAL
  Filled 2021-07-16 (×9): qty 2

## 2021-07-16 MED ORDER — INSULIN ASPART 100 UNIT/ML IJ SOLN
0.0000 [IU] | Freq: Three times a day (TID) | INTRAMUSCULAR | Status: DC
  Administered 2021-07-16: 5 [IU] via SUBCUTANEOUS
  Administered 2021-07-16 – 2021-07-18 (×4): 3 [IU] via SUBCUTANEOUS
  Administered 2021-07-19 – 2021-07-20 (×3): 2 [IU] via SUBCUTANEOUS
  Administered 2021-07-20 (×2): 3 [IU] via SUBCUTANEOUS
  Administered 2021-07-21 (×3): 2 [IU] via SUBCUTANEOUS
  Administered 2021-07-22: 5 [IU] via SUBCUTANEOUS
  Administered 2021-07-22: 2 [IU] via SUBCUTANEOUS

## 2021-07-16 MED ORDER — PROPRANOLOL HCL ER 60 MG PO CP24
60.0000 mg | ORAL_CAPSULE | Freq: Every day | ORAL | Status: DC
  Administered 2021-07-16 – 2021-07-22 (×7): 60 mg via ORAL
  Filled 2021-07-16 (×9): qty 1

## 2021-07-16 MED ORDER — OXYCODONE HCL ER 15 MG PO T12A
15.0000 mg | EXTENDED_RELEASE_TABLET | Freq: Two times a day (BID) | ORAL | Status: DC
  Administered 2021-07-16 – 2021-07-22 (×12): 15 mg via ORAL
  Filled 2021-07-16 (×12): qty 1

## 2021-07-16 MED ORDER — QUETIAPINE FUMARATE 50 MG PO TABS
50.0000 mg | ORAL_TABLET | Freq: Every day | ORAL | Status: DC
  Administered 2021-07-16 – 2021-07-18 (×3): 50 mg via ORAL
  Filled 2021-07-16 (×5): qty 1

## 2021-07-16 MED ORDER — FAMOTIDINE 20 MG PO TABS
20.0000 mg | ORAL_TABLET | Freq: Two times a day (BID) | ORAL | Status: DC
  Administered 2021-07-16: 20 mg via ORAL
  Filled 2021-07-16 (×5): qty 1

## 2021-07-16 MED ORDER — EMPAGLIFLOZIN 25 MG PO TABS
25.0000 mg | ORAL_TABLET | Freq: Every day | ORAL | Status: DC
  Administered 2021-07-17 – 2021-07-22 (×6): 25 mg via ORAL
  Filled 2021-07-16 (×8): qty 1

## 2021-07-16 MED ORDER — VENLAFAXINE HCL ER 75 MG PO CP24
75.0000 mg | ORAL_CAPSULE | Freq: Every day | ORAL | Status: AC
  Administered 2021-07-18 – 2021-07-19 (×2): 75 mg via ORAL
  Filled 2021-07-16 (×2): qty 1

## 2021-07-16 MED ORDER — HYDROXYZINE HCL 50 MG PO TABS
50.0000 mg | ORAL_TABLET | Freq: Two times a day (BID) | ORAL | Status: DC | PRN
Start: 2021-07-16 — End: 2021-07-22
  Administered 2021-07-16: 50 mg via ORAL
  Filled 2021-07-16: qty 1

## 2021-07-16 MED ORDER — ACETAMINOPHEN 325 MG PO TABS
650.0000 mg | ORAL_TABLET | Freq: Four times a day (QID) | ORAL | Status: DC | PRN
  Administered 2021-07-19 – 2021-07-21 (×4): 650 mg via ORAL
  Filled 2021-07-16 (×4): qty 2

## 2021-07-16 MED ORDER — INSULIN GLARGINE-YFGN 100 UNIT/ML ~~LOC~~ SOLN
30.0000 [IU] | Freq: Every day | SUBCUTANEOUS | Status: DC
Start: 2021-07-16 — End: 2021-07-22
  Administered 2021-07-16 – 2021-07-21 (×6): 30 [IU] via SUBCUTANEOUS

## 2021-07-16 MED ORDER — LORAZEPAM 1 MG PO TABS: 1.0000 mg | ORAL_TABLET | Freq: Four times a day (QID) | ORAL | Status: AC | PRN

## 2021-07-16 MED ORDER — DOXEPIN HCL 25 MG PO CAPS
25.0000 mg | ORAL_CAPSULE | Freq: Every day | ORAL | Status: DC
  Filled 2021-07-16 (×2): qty 1

## 2021-07-16 MED ORDER — VENLAFAXINE HCL ER 150 MG PO CP24
150.0000 mg | ORAL_CAPSULE | Freq: Every day | ORAL | Status: DC
  Administered 2021-07-16: 150 mg via ORAL
  Filled 2021-07-16 (×3): qty 1

## 2021-07-16 MED ORDER — NICOTINE 14 MG/24HR TD PT24
14.0000 mg | MEDICATED_PATCH | Freq: Every day | TRANSDERMAL | Status: DC
  Filled 2021-07-16 (×8): qty 1

## 2021-07-16 MED ORDER — ALBUTEROL SULFATE HFA 108 (90 BASE) MCG/ACT IN AERS: 2.0000 | INHALATION_SPRAY | Freq: Four times a day (QID) | RESPIRATORY_TRACT | Status: DC | PRN

## 2021-07-16 MED ORDER — RISPERIDONE 2 MG PO TBDP: 2.0000 mg | ORAL_TABLET | Freq: Three times a day (TID) | ORAL | Status: DC | PRN

## 2021-07-16 MED ORDER — CYCLOBENZAPRINE HCL 10 MG PO TABS
10.0000 mg | ORAL_TABLET | Freq: Three times a day (TID) | ORAL | Status: DC | PRN
  Administered 2021-07-16 – 2021-07-21 (×8): 10 mg via ORAL
  Filled 2021-07-16 (×8): qty 1

## 2021-07-16 NOTE — Tx Team (Signed)
Initial Treatment Plan 07/16/2021 3:41 AM Kathyrn Lass [id number]    PATIENT STRESSORS: Health problems   Marital or family conflict     PATIENT STRENGTHS: Ability for insight  Communication skills  General fund of knowledge  Motivation for treatment/growth    PATIENT IDENTIFIED PROBLEMS: Suicide attempt  "I have been feeling very sad and depressed"                   DISCHARGE CRITERIA:  Improved stabilization in mood, thinking, and/or behavior Motivation to continue treatment in a less acute level of care Verbal commitment to aftercare and medication compliance  PRELIMINARY DISCHARGE PLAN: Outpatient therapy Return to previous living arrangement  PATIENT/FAMILY INVOLVEMENT: This treatment plan has been presented to and reviewed with the patient, SALVATOR SEPPALA.  The patient have been given the opportunity to ask questions and make suggestions.  Margarita Rana, RN 07/16/2021, 3:41 AM
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
## 2021-07-16 NOTE — Progress Notes (Signed)
Adult Psychoeducational Group Note  Date:  07/16/2021 Time:  9:05 PM  Group Topic/Focus:  Wrap-Up Group:   The focus of this group is to help patients review their daily goal of treatment and discuss progress on daily workbooks.  Participation Level:  Active  Participation Quality:  Appropriate  Affect:  Appropriate  Cognitive:  Appropriate  Insight: Appropriate  Engagement in Group:  Engaged  Modes of Intervention:  Discussion  Additional Comments:  Pt attended group and actively participated.  Tonia Brooms D 07/16/2021, 9:05 PM

## 2021-07-16 NOTE — [facility] Suicide Risk Assessment (Addendum)
Suicide Risk Assessment  Admission Assessment    [facility] Admission Suicide Risk Assessment   Nursing information obtained from:  Patient  Demographic factors:  Male, Caucasian  Current Mental Status:  Suicidal ideation indicated by patient  Loss Factors:  Loss of significant relationship  Historical Factors:  Prior suicide attempts, Family history of mental illness or substance abuse, Impulsivity  Risk Reduction Factors:  Living with another person, especially a relative  Total Time spent with patient:  1.20 hrs  Principal Problem: MDD (major depressive disorder), recurrent episode, severe (HCC)  Diagnosis:  Principal Problem:   MDD (major depressive disorder), recurrent episode, severe (HCC)  Subjective Data: (Per admission evaluation notes): This is the first psychiatric admission in this [facility] for this 51 year old Caucasian male with prior hx of mental illness & other chronic health conditions. He was brought to the [facility] with complaint of suicide attempt by overdose on 5 tablets of Lunesta. Patient apparently on Valentine's day night informed his girlfriend that he has taken a whole a bottle of Lunesta tablets in a suicide attempt. Patient's girlfriend did call the ems to transport patient to the hospital for evaluation. After medical evaluation/clearance, he was transferred to the [facility] for further psychiatric evaluation & treatments. Chart has been reviewed. There are not much information on the chart other than few staff notes. Reviewed vital signs, stable, Reviewed current lab results, his toxicology results revealed a BAL of 18. Patient admitted had about 8 bottles of beer on Valentine's day night prior taking the 5 Lunesta tabs & the ems taking him to the ED for evaluation. During this assessment, Alf reports,  "I was taken to the [facility] ED on Valentine's day night. My girlfriend called the 911. I have been getting very depressed lately & my anger issues were getting the  best of me & out of hand. I have been on Effexor for my depression & it used to work well. I have been on it for 14 years. I was receiving mental health care & counseling services with [facility], but they closed their clinic in 2019. I have not been able to get another psychiatric provider. Then, I relied on my primary care provider for my mental health prescriptions (Effexor & Trazodone). But, since [facility] closed, it seems like my symptoms are getting worse & worse. I have bad anxiety issues. I cannot sleep at night even with Trazodone 200 mg. I noticed that my symptoms are at their worse since 2020 to be exact.  I have had 6 psychiatric admissions over the years, but none lately. While I was hospitalized in the past, I was diagnosed with generalized anxiety disorder, severe depression & bipolar disorder. I have been wanting to find a way to have my medicines evaluated by a psychiatric to see the reason they do not work any more. But, I was not able to do it. So, on that Valentine's day night, I intentionally tool 5 tablets of my Lunesta pills while I was drinking alcohol. I lied to my girlfriend that I took the whole bottle. She panicked & call 911 & here I'm. I feel bad that I told her a fib.  I started the Lunesta about a month ago, but it was not doing me any good. Ambien has worked in the past for me. I take a lot medicines for my other medical conditions (HTN, blood clot problems, GERD, chronic pain). I go to the [facility] pain clinic for my pain management & I go  to the [facility] primary care for all my medical care. I had attempted suicide in the past x 3. On one of those attempts, I took a bunch of benadryl pills & drank pesticide. But the pesticide caused me to throw-up & all the benadryl pills came out. I do not hear voices or see things other people could not hear or see. I have no paranoia or delusions. But, I have this problem whereby I will have this feeling of an impending doom. I need three things from  you guys, Switch me to an antidepressant that will help me, treat my anxiety & insomnia & find me a mental health provider. There are no completed suicide in my family".  Continued Clinical Symptoms:  Alcohol Use Disorder Identification Test Final Score (AUDIT): 4 The "Alcohol Use Disorders Identification Test", Guidelines for Use in Primary Care, Second Edition.  World Science writer [facility]). Score between 0-7:  no or low risk or alcohol related problems. Score between 8-15:  moderate risk of alcohol related problems. Score between 16-19:  high risk of alcohol related problems. Score 20 or above:  warrants further diagnostic evaluation for alcohol dependence and treatment.  CLINICAL FACTORS:   Severe Anxiety and/or Agitation Bipolar Disorder:   Depressive phase Depression:   Insomnia Severe More than one psychiatric diagnosis Previous Psychiatric Diagnoses and Treatments Medical Diagnoses and Treatments/Surgeries   Musculoskeletal: Strength & Muscle Tone: within normal limits Gait & Station: normal Patient leans: N/A  Psychiatric Specialty Exam:  Presentation  General Appearance: Appropriate for Environment; Casual; Fairly Groomed  Eye Contact:Good  Speech:Normal Rate; Clear and Coherent  Speech Volume:Normal  Handedness:Right   Mood and Affect  Mood:Depressed; Anxious  Affect:Congruent; Depressed   Thought Process  Thought Processes:Coherent; Goal Directed; Linear  Descriptions of Associations:Intact  Orientation:Full (Time, Place and Person)  Thought Content:Logical  History of Schizophrenia/Schizoaffective disorder:No  Duration of Psychotic Symptoms:Greater than six months  Hallucinations:Hallucinations: None  Ideas of Reference:None  Suicidal Thoughts:Suicidal Thoughts: No  Homicidal Thoughts:Homicidal Thoughts: No   Sensorium  Memory:Immediate Good; Recent Good; Remote Good  Judgment:Fair  Insight:Fair   Executive Functions   Concentration:Good  Attention Span:Good  Recall:Good  Fund of Knowledge:Fair  Language:Good  Psychomotor Activity  Psychomotor Activity:Psychomotor Activity: Normal  Assets  Assets:Communication Skills; Desire for Improvement; Financial Resources/Insurance; Housing; Resilience; Social Support  Sleep  Sleep:Sleep: Poor Number of Hours of Sleep: 4  Physical Exam: See H&P  Blood pressure 108/86, pulse 93, temperature (!) 97.5 F (36.4 C), temperature source Oral, resp. rate 18, height 6' (1.829 m), weight 101.4 kg, SpO2 100 %. Body mass index is 30.33 kg/m.  COGNITIVE FEATURES THAT CONTRIBUTE TO RISK:  None    SUICIDE RISK:   Moderate - given recent overdose on Lunesta and remote h/o previous suicide attempts.   PLAN OF CARE: Treatment Plan Summary: Daily contact with patient to assess and evaluate symptoms and progress in treatment and Medication management.    Diagnoses:  Major depressive disorder, recurrent episodes severe. Generalized anxiety disorder. Bipolar disorder, depressed-type.   Other medical conditions: Asthma. HTN Diabetes Mellitus.  PAD?   Treatment Plan/Recommendations: 1. Admit for crisis management and stabilization, estimated length of stay 3-5 days.    2. Medication management to reduce current symptoms to base line and improve the patient's overall level of functioning: See [facility] for plan of care.   Plan: Major depressive disorder/Bipolar disorder, depressed-type. -Initiated Duloxetine 30 mg po daily. -Increased Seroquel from 25 mg to 50 mg po Q bedtime. -Taper off  Effexor as recommended (See MAR).    Anxiety. -Continue Vistaril 50 mg po bid prn. -Continue Inderal ER 60 mg po daily.   Insomnia.  -Initiated Melatonin 6 mg po Q bedtime.  -Trazodone 200 mg po Q hs.   Agitation protocols, -Continue Risperdal-odt 2 mg tid prn & Ativan 1 mg po prn x 1 dose & Geodon 20 mg IM x 1 dose prn.   Other medical issues, continue,  Albuterol  inhaler 2-puff Q 6 hrs prn for SOB. Lipitor 40 mg po Q daily for hyperlipidemia. Astelin Nasal spray, I spray per nare bid for allergies. Flexeril 10 mg po tid for muscle spasms.  Sliding scale insulin (NOVOLog 0-15 units per CBG tid. OxyContin 15 mg po Q 12 hours for chronic pain.  Lyrical 150 mg po bid for neuropathic pain.  Propranolol 60 mg po daily for HTN. Protonix 40 mg po Q am for GERD. Xarelto 20 mg po daily after breakfast for prevention of blood clot. Nicotine patch 14 mg trans-dermally for nicotine withdrawal.   Other prn medications, continue: Acetaminophen 650 mg po Q 6 hrs prn for pain/fever. Mylanta 30 ml po Q 4 hrs prn for indigestion. MOM 30 ml po Q daily prn for constipation.  I certify that inpatient services furnished can reasonably be expected to improve the patient's condition.   Armandina Stammer, NP, pmhnp, fnp-bc 07/16/2021, 4:15 PM
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
## 2021-07-16 NOTE — Progress Notes (Signed)
PATIENT SIGNED A 72 HR REQUEST FOR DISCHARGE ON   07/16/2021   AT   1410.
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
## 2021-07-16 NOTE — Progress Notes (Signed)
°   07/16/21 1300  Psych Admission Type (Psych Patients Only)  Admission Status Involuntary  Psychosocial Assessment  Patient Complaints Depression  Eye Contact Fair  Facial Expression Flat  Affect Depressed  Speech Soft  Interaction Assertive  Motor Activity Slow  Appearance/Hygiene In scrubs  Behavior Characteristics Cooperative  Mood Depressed  Thought Process  Coherency WDL  Content WDL  Delusions WDL  Perception WDL  Hallucination None reported or observed  Judgment Poor  Confusion None  Danger to Self  Current suicidal ideation? Denies  Self-Injurious Behavior No self-injurious ideation or behavior indicators observed or expressed   Agreement Not to Harm Self Yes  Description of Agreement verbal  Danger to Others  Danger to Others None reported or observed

## 2021-07-17 ENCOUNTER — Encounter (HOSPITAL_COMMUNITY): Payer: Self-pay

## 2021-07-17 DIAGNOSIS — G894 Chronic pain syndrome: Secondary | ICD-10-CM

## 2021-07-17 DIAGNOSIS — R251 Tremor, unspecified: Secondary | ICD-10-CM

## 2021-07-17 DIAGNOSIS — D509 Iron deficiency anemia, unspecified: Secondary | ICD-10-CM

## 2021-07-17 DIAGNOSIS — E669 Obesity, unspecified: Secondary | ICD-10-CM

## 2021-07-17 DIAGNOSIS — E119 Type 2 diabetes mellitus without complications: Secondary | ICD-10-CM

## 2021-07-17 DIAGNOSIS — G4700 Insomnia, unspecified: Secondary | ICD-10-CM

## 2021-07-17 DIAGNOSIS — K219 Gastro-esophageal reflux disease without esophagitis: Secondary | ICD-10-CM

## 2021-07-17 DIAGNOSIS — E785 Hyperlipidemia, unspecified: Secondary | ICD-10-CM

## 2021-07-17 DIAGNOSIS — I10 Essential (primary) hypertension: Secondary | ICD-10-CM

## 2021-07-17 LAB — IRON AND TIBC
Iron: 144 ug/dL (ref 45–182)
Saturation Ratios: 38 % (ref 17.9–39.5)
TIBC: 377 ug/dL (ref 250–450)
UIBC: 233 ug/dL

## 2021-07-17 LAB — GLUCOSE, CAPILLARY
Glucose-Capillary: 211 mg/dL — ABNORMAL HIGH (ref 70–99)
Glucose-Capillary: 120 mg/dL — ABNORMAL HIGH (ref 70–99)
Glucose-Capillary: 163 mg/dL — ABNORMAL HIGH (ref 70–99)
Glucose-Capillary: 163 mg/dL — ABNORMAL HIGH (ref 70–99)
Glucose-Capillary: 139 mg/dL — ABNORMAL HIGH (ref 70–99)

## 2021-07-17 LAB — FERRITIN: Ferritin: 52 ng/mL (ref 24–336)

## 2021-07-17 MED ORDER — FERROUS SULFATE 325 (65 FE) MG PO TABS
325.0000 mg | ORAL_TABLET | Freq: Once | ORAL | Status: AC
  Administered 2021-07-17: 325 mg via ORAL
  Filled 2021-07-17: qty 1

## 2021-07-17 MED ORDER — SENNOSIDES-DOCUSATE SODIUM 8.6-50 MG PO TABS
1.0000 | ORAL_TABLET | Freq: Every day | ORAL | Status: DC
  Administered 2021-07-17 – 2021-07-21 (×5): 1 via ORAL
  Filled 2021-07-17 (×8): qty 1

## 2021-07-17 MED ORDER — DULOXETINE HCL 30 MG PO CPEP
30.0000 mg | ORAL_CAPSULE | Freq: Every day | ORAL | Status: AC
  Administered 2021-07-17: 30 mg via ORAL
  Filled 2021-07-17: qty 1

## 2021-07-17 MED ORDER — FERROUS SULFATE 325 (65 FE) MG PO TABS
325.0000 mg | ORAL_TABLET | Freq: Every day | ORAL | Status: DC
  Administered 2021-07-17 – 2021-07-21 (×5): 325 mg via ORAL
  Filled 2021-07-17 (×7): qty 1

## 2021-07-17 NOTE — Group Note (Signed)
LCSW Group Therapy Note   Group Date: 07/17/2021 Start Time: 1300 End Time: 1400   Type of Therapy and Topic:  Group Therapy:  Recognizing Triggers  Participation Level:  Active   Description of Group:   Recognizing Triggers: Patients defined triggers and discussed the importance of recognizing their personal warning signs. Patients identified their own triggers and how they tend to cope with stressful situations. Patients discussed areas such as people, places, things, and thoughts that rigger certain emotions for them. CSW provided support to patients and discussed safety planning for when these triggers occur. Group participants had opportunities to share openly with the group and participate in a group discussion while providing support and feedback to their peers.  Therapeutic Goals: Patient will identify triggers that are contributing to a problem in their life Patient will identify unwanted behaviors and feelings associated with a trigger.  Patient will share with other group members strategies to confront and avoid triggers so that they may be able to react appropriately to triggers in daily life.    Summary of Patient Progress:   The Pt came to group and remained there the entire time.  The Pt accepted the worksheets that were provided and followed along during the group.  The Pt participated in the introduction question and the group discussion.  They showed understanding of the topic being discussed and respect for their peers.     Therapeutic Modalities:   Cognitive Behavioral Therapy Solution Focused Therapy Motivational Interviewing Family Systems Approach  Aram Beecham, [place] 07/17/2021  1:48 PM

## 2021-07-17 NOTE — Progress Notes (Signed)
Patient did attend the evening speaker AA meeting.  

## 2021-07-17 NOTE — Progress Notes (Signed)
°   07/17/21 1400  Psych Admission Type (Psych Patients Only)  Admission Status Involuntary  Psychosocial Assessment  Patient Complaints Depression  Eye Contact Fair  Facial Expression Flat  Affect Depressed  Speech Soft  Interaction Assertive  Motor Activity Slow  Appearance/Hygiene In scrubs  Behavior Characteristics Cooperative  Mood Depressed  Thought Process  Coherency WDL  Content WDL  Delusions WDL  Perception WDL  Hallucination None reported or observed  Judgment Poor  Confusion None  Danger to Self  Current suicidal ideation? Denies  Danger to Others  Danger to Others None reported or observed

## 2021-07-17 NOTE — [facility] Group Notes (Signed)
Patient did not attend morning orientation/goal group. ?

## 2021-07-17 NOTE — Group Note (Signed)
Recreation Therapy Group Note   Group Topic:Stress Management  Group Date: 07/17/2021 Start Time: 0930 End Time: 0950 Facilitators: Caroll Rancher, [place] Location: [address] Dayroom   Goal Area(s) Addresses:  Patient will identify positive stress management techniques. Patient will identify benefits of using stress management post d/c.   Group Description:  Meditation.  LRT played a meditation that focused on letting go of the past.  Patients were listen to the meditation while focusing on their breathing and allowing the breathing to relax them as much as possible.  After the meditation, LRT explained to patients about accessing stress management tools through Apps, Youtube, yoga, etc.    Affect/Mood: Appropriate   Participation Level: Active   Participation Quality: Independent   Behavior: Attentive    Speech/Thought Process: Focused   Insight: Good   Judgement: Good   Modes of Intervention: Meditation   Patient Response to Interventions:  Attentive   Education Outcome:  Acknowledges education and In group clarification offered    Clinical Observations/Individualized Feedback:   Pt attended and participated in group activity.    Plan: Continue to engage patient in RT group sessions 2-3x/week.   Caroll Rancher, LRT,CTRS 07/17/2021 11:57 AM

## 2021-07-17 NOTE — Progress Notes (Signed)
Patient has been up and attended group on tonight. He c/o lower back pain and received pain medication. He reports having had a good day overall. Support given and safety maintained on unit with 15 min checks

## 2021-07-17 NOTE — Progress Notes (Signed)
[facility] MD Progress Note  07/17/2021 5:05 PM Tanner Mcgrath  MRN:  [id number] Subjective:  "I need to learn how to manage my anger and my depression." Principal Problem: MDD (major depressive disorder), recurrent episode, severe (HCC) Diagnosis: Principal Problem:   MDD (major depressive disorder), recurrent episode, severe (HCC) Active Problems:   Chronic pain syndrome   Diabetes mellitus (HCC)   Essential hypertension with goal blood pressure less than 130/85   GAD (generalized anxiety disorder)   Hyperlipidemia LDL goal <100   Insomnia   Iron deficiency anemia   Obesity, Class I, BMI 30-34.9   Occasional tremors   GERD (gastroesophageal reflux disease)  Chief Complaint: Depression and suicide attempt by overdose.  Reason for Admission:  Tanner Mcgrath is a 51 y.o. male with a history of depression, and anxiety, self reported anger management issues and possible Bipolar disorder who was initially admitted for inpatient psychiatric hospitalization on 07/16/2021 for management of worsening deprssion andsuicidal thoughts. The patient is currently on Hospital Day 1.   Chart Review from last 24 hours:  The patient's chart was reviewed and nursing notes were reviewed. The patient's case was discussed in multidisciplinary team meeting. Per [facility] Patient was administered the following PRM medications.  Flexeril, hydroxyzine, and trazodone.  He has been compliant with his scheduled medications.  Information Obtained Today During Patient Interview: The patient was seen and evaluated on the unit. On assessment today the patient reports that he slept well last night.  He expressed concern of restarting Seroquel, noting that when he previously took Seroquel he had worsening restless leg syndrome.  He does endorse some uneasy sensation today which he relates to tapering off of venlafaxine.  He is hopeful, however that duloxetine will be effective at managing his depression and anxiety, and is optimistic  that it will help manage his chronic pain disorder.  We will plan to increase duloxetine to 60 mg today while patient continues to wean off of venlafaxine.  Patient states that his appetite has been up and down, but he has been able to tolerate the food here in the hospital.  He denies any suicidal ideation today.  He denies any homicidal ideation.  He has not been having any auditory or visual hallucinations.  Patient's affect is pleasant, and he makes good eye contact.  He is engaged in his care, and is anticipating learning coping skills at group therapy that can help him manage his anger.   In reviewing sleep symptoms, patient does have a significant history for iron deficient anemia and has required iron transfusions in the past.  Discussed with patient how antipsychotics can deplete dopamine which will cause worsening restless leg syndrome, and reviewed the association of RLS with iron deficiency.  Patient is agreeable to taking an iron supplement.  He does state that he has some difficulties with constipation and may have some intermittent loose stools.  He is agreeable to starting a fiber supplement.  He previously took magnesium for leg cramps, but does not feel he has been having leg cramps recently.  Also of note, he was previously diagnosed with obstructive sleep apnea which was somewhat resolved after weight loss.  He has had an increase in weight again and notes that when he has his dream state sleep, he will have dreams of being underwater and being unable to breathe and awaken suddenly in a frightened state gasping for air.  Reviewed how poor sleep quality can affect mood and irritability.  In regards to his  possible bipolar disorder, patient states that he completed his MDQ and gave it to a staff member, but cannot remember which one.  He describes that he will go 2-4 days without requiring sleep.  He describes that in these days, he may have "microsleep's" where he will sleep briefly and arouse  with good energy.  He does have a family history of bipolar disorder in his mother.  Reviewed with patient that Seroquel is an effective mood stabilizer, and dose can continue to be titrated at for mood control.  Discussed that patient is now on 3 medications for sleep (Seroquel, melatonin, and trazodone, and stressed the importance of having a repeat sleep study and treating obstructive sleep apnea should he continue on sedating medications.  Patient is agreeable and appreciative.  Blood sugar has been controlled today.  Patient is due for next Trulicity injection on Monday 07/20/2021.  Total Time Spent in Direct Patient Care:  I personally spent 40 minutes on the unit in direct patient care. The direct patient care time included face-to-face time with the patient, reviewing the patient's chart, communicating with other professionals, and coordinating care. Greater than 50% of this time was spent in counseling or coordinating care with the patient regarding goals of hospitalization, psycho-education, and discharge planning needs.  Past Psychiatric History: Major depressive disorder, GAD, questionable Bipolar disorder  Past Medical History:  Past Medical History:  Diagnosis Date   Diabetes mellitus without complication (HCC)    History reviewed. No pertinent surgical history. Family History: History reviewed. No pertinent family history. Family Psychiatric  History: Mother- Bipolar disorder  Social History:  Social History   Substance and Sexual Activity  Alcohol Use None     Social History   Substance and Sexual Activity  Drug Use Not Currently    Social History   Socioeconomic History   Marital status: Legally Separated    Spouse name: Not on file   Number of children: Not on file   Years of education: Not on file   Highest education level: Not on file  Occupational History   Not on file  Tobacco Use   Smoking status: Never   Smokeless tobacco: Current  Substance and Sexual  Activity   Alcohol use: Not on file   Drug use: Not Currently   Sexual activity: Yes  Other Topics Concern   Not on file  Social History Narrative   Not on file   Social Determinants of Health   Financial Resource Strain: Not on file  Food Insecurity: Not on file  Transportation Needs: Not on file  Physical Activity: Not on file  Stress: Not on file  Social Connections: Not on file   Additional Social History:           Single (has a girlfriend), has 1 daughter, lives in [place], [place], unemployed (disabled due to mental health/medical conditions).              Sleep: Good  Appetite:  Good  Current Medications: Current Facility-Administered Medications  Medication Dose Route Frequency Provider Last Rate Last Admin   acetaminophen (TYLENOL) tablet 650 mg  650 mg Oral Q6H PRN Starkes-Perry, Juel Burrow, FNP       albuterol (VENTOLIN HFA) 108 (90 Base) MCG/ACT inhaler 2 puff  2 puff Inhalation Q6H PRN Nwoko, Agnes I, NP       alum & mag hydroxide-simeth (MAALOX/MYLANTA) 200-200-20 MG/5ML suspension 30 mL  30 mL Oral Q4H PRN Starkes-Perry, Juel Burrow, FNP  atorvastatin (LIPITOR) tablet 40 mg  40 mg Oral Daily Maryagnes Amos, FNP   40 mg at 07/17/21 0825   azelastine (ASTELIN) 0.1 % nasal spray 1 spray  1 spray Each Nare BID Maryagnes Amos, FNP   1 spray at 07/17/21 1610   cyclobenzaprine (FLEXERIL) tablet 10 mg  10 mg Oral TID PRN Comer Locket, MD   10 mg at 07/16/21 1822   [START ON 07/18/2021] DULoxetine (CYMBALTA) DR capsule 60 mg  60 mg Oral Daily Comer Locket, MD       empagliflozin (JARDIANCE) tablet 25 mg  25 mg Oral Daily Mason Jim, Amy E, MD   25 mg at 07/17/21 9604   ferrous sulfate tablet 325 mg  325 mg Oral QHS Mariel Craft, MD       hydrocerin (EUCERIN) cream   Topical BID Comer Locket, MD   1 application at 07/17/21 5409   hydrocortisone cream 1 %   Topical BID PRN Comer Locket, MD       hydrOXYzine (ATARAX) tablet 50 mg  50  mg Oral BID PRN Maryagnes Amos, FNP   50 mg at 07/16/21 1722   insulin aspart (novoLOG) injection 0-15 Units  0-15 Units Subcutaneous TID WC Maryagnes Amos, FNP   3 Units at 07/17/21 1309   insulin glargine-yfgn (SEMGLEE) injection 30 Units  30 Units Subcutaneous QHS Comer Locket, MD   30 Units at 07/16/21 2119   loperamide (IMODIUM) capsule 2-4 mg  2-4 mg Oral PRN Comer Locket, MD       risperiDONE (RISPERDAL M-TABS) disintegrating tablet 2 mg  2 mg Oral Q8H PRN Starkes-Perry, Juel Burrow, FNP       And   LORazepam (ATIVAN) tablet 1 mg  1 mg Oral PRN Starkes-Perry, Juel Burrow, FNP       And   ziprasidone (GEODON) injection 20 mg  20 mg Intramuscular PRN Starkes-Perry, Juel Burrow, FNP       LORazepam (ATIVAN) tablet 1 mg  1 mg Oral Q6H PRN Mason Jim, Amy E, MD       magnesium hydroxide (MILK OF MAGNESIA) suspension 30 mL  30 mL Oral Daily PRN Starkes-Perry, Juel Burrow, FNP       melatonin tablet 6 mg  6 mg Oral QHS Nwoko, Agnes I, NP   6 mg at 07/16/21 2116   multivitamin with minerals tablet 1 tablet  1 tablet Oral Daily Comer Locket, MD   1 tablet at 07/17/21 0825   nicotine (NICODERM CQ - dosed in mg/24 hours) patch 14 mg  14 mg Transdermal Daily Massengill, Nathan, MD       ondansetron (ZOFRAN-ODT) disintegrating tablet 4 mg  4 mg Oral Q6H PRN Comer Locket, MD       oxyCODONE (OXYCONTIN) 12 hr tablet 15 mg  15 mg Oral Q12H Nwoko, Nicole Kindred I, NP   15 mg at 07/17/21 0826   pantoprazole (PROTONIX) EC tablet 40 mg  40 mg Oral Daily Maryagnes Amos, FNP   40 mg at 07/17/21 0826   pregabalin (LYRICA) capsule 150 mg  150 mg Oral BID Armandina Stammer I, NP   150 mg at 07/17/21 0826   propranolol ER (INDERAL LA) 24 hr capsule 60 mg  60 mg Oral Daily Maryagnes Amos, FNP   60 mg at 07/17/21 0827   QUEtiapine (SEROQUEL) tablet 50 mg  50 mg Oral QHS Armandina Stammer I, NP   50 mg at 07/16/21  2116   rivaroxaban (XARELTO) tablet 20 mg  20 mg Oral QPC breakfast Maryagnes AmosStarkes-Perry, Takia S,  FNP   20 mg at 07/17/21 16100826   senna-docusate (Senokot-S) tablet 1 tablet  1 tablet Oral QHS Mariel CraftMaurer, Ronald Londo M, MD       thiamine tablet 100 mg  100 mg Oral Daily Mason JimSingleton, Amy E, MD   100 mg at 07/17/21 [id number]   traZODone (DESYREL) tablet 200 mg  200 mg Oral QHS PRN Comer LocketSingleton, Amy E, MD   200 mg at 07/16/21 2116   [START ON 07/20/2021] venlafaxine XR (EFFEXOR-XR) 24 hr capsule 37.5 mg  37.5 mg Oral Q breakfast Comer LocketSingleton, Amy E, MD       [START ON 07/18/2021] venlafaxine XR (EFFEXOR-XR) 24 hr capsule 75 mg  75 mg Oral Q breakfast Comer LocketSingleton, Amy E, MD        Lab Results:  Results for orders placed or performed during the hospital encounter of 07/16/21 (from the past 48 hour(s))  Glucose, capillary     Status: Abnormal   Collection Time: 07/16/21  5:42 AM  Result Value Ref Range   Glucose-Capillary 107 (H) 70 - 99 mg/dL    Comment: Glucose reference range applies only to samples taken after fasting for at least 8 hours.   Comment 1 Notify RN   TSH     Status: None   Collection Time: 07/16/21  6:20 AM  Result Value Ref Range   TSH 2.576 0.350 - 4.500 uIU/mL    Comment: Performed by a 3rd Generation assay with a functional sensitivity of <=0.01 uIU/mL. Performed at [facility], [address] [place], [place] [address]   Lipid panel     Status: Abnormal   Collection Time: 07/16/21  6:20 AM  Result Value Ref Range   Cholesterol 144 0 - 200 mg/dL   Triglycerides 119202 (H) <150 mg/dL   HDL 39 (L) >14>40 mg/dL   Total CHOL/HDL Ratio 3.7 RATIO   VLDL 40 0 - 40 mg/dL   LDL Cholesterol 65 0 - 99 mg/dL    Comment:        Total Cholesterol/HDL:CHD Risk Coronary Heart Disease Risk Table                     Men   Women  1/2 Average Risk   3.4   3.3  Average Risk       5.0   4.4  2 X Average Risk   9.6   7.1  3 X Average Risk  23.4   11.0        Use the calculated Patient Ratio above and the CHD Risk Table to determine the patient's CHD Risk.        ATP III CLASSIFICATION  (LDL):  <100     mg/dL   Optimal  [id number]  mg/dL   Near or Above                    Optimal  130-159  mg/dL   Borderline  [id number]  mg/dL   High  >578>190     mg/dL   Very High Performed at [facility], [address] [place], [place] [address]   Hemoglobin A1c     Status: Abnormal   Collection Time: 07/16/21  6:20 AM  Result Value Ref Range   Hgb A1c MFr Bld 7.9 (H) 4.8 - 5.6 %    Comment: (NOTE) Pre diabetes:  5.7%-6.4%  Diabetes:              >6.4%  Glycemic control for   <7.0% adults with diabetes    Mean Plasma Glucose 180.03 mg/dL    Comment: Performed at [facility], [address] [place], [place] [address]  Glucose, capillary     Status: Abnormal   Collection Time: 07/16/21 11:58 AM  Result Value Ref Range   Glucose-Capillary 158 (H) 70 - 99 mg/dL    Comment: Glucose reference range applies only to samples taken after fasting for at least 8 hours.   Comment 1 Notify RN   Glucose, capillary     Status: Abnormal   Collection Time: 07/16/21  4:53 PM  Result Value Ref Range   Glucose-Capillary 211 (H) 70 - 99 mg/dL    Comment: Glucose reference range applies only to samples taken after fasting for at least 8 hours.   Comment 1 Notify RN   Glucose, capillary     Status: Abnormal   Collection Time: 07/16/21  8:50 PM  Result Value Ref Range   Glucose-Capillary 155 (H) 70 - 99 mg/dL    Comment: Glucose reference range applies only to samples taken after fasting for at least 8 hours.  Glucose, capillary     Status: Abnormal   Collection Time: 07/17/21  5:47 AM  Result Value Ref Range   Glucose-Capillary 120 (H) 70 - 99 mg/dL    Comment: Glucose reference range applies only to samples taken after fasting for at least 8 hours.   Comment 1 Notify RN    Comment 2 Document in Chart   Glucose, capillary     Status: Abnormal   Collection Time: 07/17/21 12:53 PM  Result Value Ref Range   Glucose-Capillary 163 (H) 70 - 99 mg/dL    Comment:  Glucose reference range applies only to samples taken after fasting for at least 8 hours.   Comment 1 Notify RN     Blood Alcohol level:  Lab Results  Component Value Date   ETH 18 (H) 07/15/2021    Metabolic Disorder Labs: Lab Results  Component Value Date   HGBA1C 7.9 (H) 07/16/2021   MPG 180.03 07/16/2021   MPG 188.64 07/15/2021   No results found for: PROLACTIN Lab Results  Component Value Date   CHOL 144 07/16/2021   TRIG 202 (H) 07/16/2021   HDL 39 (L) 07/16/2021   CHOLHDL 3.7 07/16/2021   VLDL 40 07/16/2021   LDLCALC 65 07/16/2021    Physical Findings: AIMS:  , ,  ,  ,    CIWA:  CIWA-Ar Total: 0 COWS:     Musculoskeletal: Strength & Muscle Tone: within normal limits Gait & Station: normal Patient leans: N/A  Psychiatric Specialty Exam:  Presentation  General Appearance: Appropriate for Environment; Casual; Neat  Eye Contact:Good  Speech:Clear and Coherent  Speech Volume:Normal  Handedness:Right   Mood and Affect  Mood:Anxious; Dysphoric  Affect:Congruent; Constricted   Thought Process  Thought Processes:Linear  Descriptions of Associations:Intact  Orientation:Full (Time, Place and Person)  Thought Content:Logical  History of Schizophrenia/Schizoaffective disorder:No  Duration of Psychotic Symptoms:Greater than six months  Hallucinations:Hallucinations: None  Ideas of Reference:None  Suicidal Thoughts:Suicidal Thoughts: No  Homicidal Thoughts:Homicidal Thoughts: No   Sensorium  Memory:Immediate Good; Recent Fair; Remote Fair  Judgment:Poor  Insight:Shallow   Executive Functions  Concentration:Good  Attention Span:Good  Recall:Good  Fund of Knowledge:Good  Language:Good   Psychomotor Activity  Psychomotor Activity:Psychomotor Activity: Normal   Assets  Assets:Communication Skills; Desire for Improvement; Financial Resources/Insurance; Housing; Resilience; Social Support   Sleep  Sleep:Sleep: Good Number  of Hours of Sleep: 4  Physical Exam: Physical Exam Constitutional:      Appearance: Normal appearance. He is obese.  HENT:     Head: Normocephalic and atraumatic.  Cardiovascular:     Rate and Rhythm: Normal rate.  Pulmonary:     Effort: Pulmonary effort is normal. No respiratory distress.  Musculoskeletal:        General: Normal range of motion.  Neurological:     General: No focal deficit present.     Mental Status: He is alert and oriented to person, place, and time.   Review of Systems  Constitutional: Negative.   Respiratory: Negative.    Cardiovascular: Negative.   Gastrointestinal:  Positive for constipation, diarrhea and nausea.  Neurological:  Positive for sensory change (unsettled feeling with w/d from Effexor).  Psychiatric/Behavioral:  Positive for depression. Negative for hallucinations, memory loss, substance abuse and suicidal ideas. The patient is nervous/anxious and has insomnia (improved).   Blood pressure 114/80, pulse 69, temperature 98.4 F (36.9 C), temperature source Oral, resp. rate 18, height 6' (1.829 m), weight 101.4 kg, SpO2 99 %. Body mass index is 30.33 kg/m.   Treatment Plan Summary: Daily contact with patient to assess and evaluate symptoms and progress in treatment and Medication management  Assessment:  Tanner Mcgrath is a 51 y.o. male With a history of major depressive disorder versus bipolar depression who presented after suicide attempt by overdose on Lunesta.  Currently transitioning patient from venlafaxine to duloxetine with some withdrawal effect while decreasing venlafaxine dose.  Will increase duloxetine at this time.  Seroquel was increased to maximize its effectiveness for mood stabilization.  Patient did not experience RLS symptoms last night.  Given his history of anemia, will supplement with iron so as not to further deplete brain iron.  I have added a ferritin, TIBC and iron level to his existing blood work.  If this cannot be drawn, I  do not feel it needs to be repeated as a lab draw given his history of requiring iron infusions in the past.  To help prevent constipation, will start senna-docusate.  We will continue to monitor effectiveness of medication changes.  I have encouraged patient to attend group therapy to work on identifying anger triggers and anger reactions and creating coping skills to manage his anger.   Diagnosis: MDD (major depressive disorder), recurrent episode, severe (HCC)    Pertinent findings today: - Patient's sleep is improving -Patient's appetite is improving -Patient is experienced some withdrawal effect from venlafaxine -Tolerating increase Seroquel dose -Attending group therapy       Treatment Plan Summary: Daily contact with patient to assess and evaluate symptoms and progress in treatment.   Scheduled medications/labs: - Increase duloxetine to 60 mg today for depression -Continue taper off of extended release venlafaxine 75 mg on 2/18 and 2/19 then 37.5 mg on 07/20/2021 and 07/21/2021 -Continue Seroquel 50 mg at bedtime for mood stabilization -Start ferrous sulfate at at bedtime for low iron storage/iron deficiency -Start senna-docusate at at bedtime for bowel regulation -Continue trazodone 200 mg at bedtime as needed for sleep -Continue melatonin 6 mg at bedtime for sleep -Continue to supply nicotine patch for smoking cessation -Continue CIWA protocol to include thiamine tablet and multiple vitamin, Ativan as needed, -Continue home medications for chronic medical conditions: Albuterol, Lipitor, Astelin, cyclobenzaprine, Jardiance, insulin glargine, melatonin, Xarelto, OxyContin, Protonix, Lyrica, Inderal LA  PRN's : -Unit PRN standard medications placed to include agitation protocol      Psychosocial:   1. Encouragement to attend group therapies. 2. Encourage patient to learn and implement coping strategies to avoid need for PRN medications. 3. Encouragement for medication  compliance. 4. Disposition in progress, appreciate social work assistance. 5. Patient agreeable to family meeting.    Develop treatment plan to decrease risk of relapse upon discharge and the need for readmission.  Psycho-social education regarding relapse prevention and self care.  Health care follow up as needed for medical problems-  Patient would benefit from repeat baseline PSG for evaluation of sleep apnea after discharge. (Last completed 2020 was positive for OSA) Review, reconcile, and reinstate any pertinent home medications for other health issues where appropriate. Call for consults with hospitalist for any additional specialty patient care services as needed   Mariel Craft, MD 07/17/2021, 5:05 PM

## 2021-07-17 NOTE — Progress Notes (Signed)
Inpatient Diabetes Program Recommendations  AACE/ADA: New Consensus Statement on Inpatient Glycemic Control (2015)  Target Ranges:  Prepandial:   less than 140 mg/dL      Peak postprandial:   less than 180 mg/dL (1-2 hours)      Critically ill patients:  140 - 180 mg/dL   Lab Results  Component Value Date   GLUCAP 120 (H) 07/17/2021   HGBA1C 7.9 (H) 07/16/2021    Review of Glycemic Control  Latest Reference Range & Units 07/16/21 05:42 07/16/21 11:58 07/16/21 16:53 07/16/21 20:50 07/17/21 05:47  Glucose-Capillary 70 - 99 mg/dL 572 (H) 620 (H) 355 (H) 155 (H) 120 (H)   Diabetes history:  DM 2 Outpatient Diabetes medications:  Lantus 30 units q HS, Novolog 0-12 units tid with meals Current orders for Inpatient glycemic control:  Novolog  moderate tid with meals Jardiance 25 mg daily Semglee 30 units q HS  Inpatient Diabetes Program Recommendations:    Referral received. Will follow.  Thanks,  Beryl Meager, RN, BC-ADM Inpatient Diabetes Coordinator Pager [phone]

## 2021-07-17 NOTE — BH IP Treatment Plan (Signed)
Interdisciplinary Treatment and Diagnostic Plan Update  07/17/2021 Tanner LassRichard S Mcgrath MRN: [id number]  Principal Diagnosis: MDD (major depressive disorder), recurrent episode, severe (HCC)  Secondary Diagnoses: Principal Problem:   MDD (major depressive disorder), recurrent episode, severe (HCC) Active Problems:   Chronic pain syndrome   Diabetes mellitus (HCC)   Essential hypertension with goal blood pressure less than 130/85   GAD (generalized anxiety disorder)   Hyperlipidemia LDL goal <100   Insomnia   Iron deficiency anemia   Obesity, Class I, BMI 30-34.9   Occasional tremors   GERD (gastroesophageal reflux disease)   Current Medications:  Current Facility-Administered Medications  Medication Dose Route Frequency Provider Last Rate Last Admin   acetaminophen (TYLENOL) tablet 650 mg  650 mg Oral Q6H PRN Starkes-Perry, Juel Burrowakia S, FNP       albuterol (VENTOLIN HFA) 108 (90 Base) MCG/ACT inhaler 2 puff  2 puff Inhalation Q6H PRN Nwoko, Agnes I, NP       alum & mag hydroxide-simeth (MAALOX/MYLANTA) 200-200-20 MG/5ML suspension 30 mL  30 mL Oral Q4H PRN Starkes-Perry, Juel Burrowakia S, FNP       atorvastatin (LIPITOR) tablet 40 mg  40 mg Oral Daily Maryagnes AmosStarkes-Perry, Takia S, FNP   40 mg at 07/17/21 0825   azelastine (ASTELIN) 0.1 % nasal spray 1 spray  1 spray Each Nare BID Maryagnes AmosStarkes-Perry, Takia S, FNP   1 spray at 07/17/21 0826   cyclobenzaprine (FLEXERIL) tablet 10 mg  10 mg Oral TID PRN Comer LocketSingleton, Amy E, MD   10 mg at 07/16/21 1822   [START ON 07/18/2021] DULoxetine (CYMBALTA) DR capsule 60 mg  60 mg Oral Daily Mason JimSingleton, Amy E, MD       empagliflozin (JARDIANCE) tablet 25 mg  25 mg Oral Daily Mason JimSingleton, Amy E, MD   25 mg at 07/17/21 [id number]   ferrous sulfate tablet 325 mg  325 mg Oral QHS Mariel CraftMaurer, Sheila M, MD       hydrocerin (EUCERIN) cream   Topical BID Comer LocketSingleton, Amy E, MD   1 application at 07/17/21 [id number]   hydrocortisone cream 1 %   Topical BID PRN Comer LocketSingleton, Amy E, MD       hydrOXYzine (ATARAX)  tablet 50 mg  50 mg Oral BID PRN Maryagnes AmosStarkes-Perry, Takia S, FNP   50 mg at 07/16/21 1722   insulin aspart (novoLOG) injection 0-15 Units  0-15 Units Subcutaneous TID WC Maryagnes AmosStarkes-Perry, Takia S, FNP   3 Units at 07/17/21 1309   insulin glargine-yfgn (SEMGLEE) injection 30 Units  30 Units Subcutaneous QHS Comer LocketSingleton, Amy E, MD   30 Units at 07/16/21 2119   loperamide (IMODIUM) capsule 2-4 mg  2-4 mg Oral PRN Comer LocketSingleton, Amy E, MD       risperiDONE (RISPERDAL M-TABS) disintegrating tablet 2 mg  2 mg Oral Q8H PRN Starkes-Perry, Juel Burrowakia S, FNP       And   LORazepam (ATIVAN) tablet 1 mg  1 mg Oral PRN Starkes-Perry, Juel Burrowakia S, FNP       And   ziprasidone (GEODON) injection 20 mg  20 mg Intramuscular PRN Starkes-Perry, Juel Burrowakia S, FNP       LORazepam (ATIVAN) tablet 1 mg  1 mg Oral Q6H PRN Mason JimSingleton, Amy E, MD       magnesium hydroxide (MILK OF MAGNESIA) suspension 30 mL  30 mL Oral Daily PRN Starkes-Perry, Juel Burrowakia S, FNP       melatonin tablet 6 mg  6 mg Oral QHS Armandina StammerNwoko, Agnes I, NP   6 mg at 07/16/21 2116  multivitamin with minerals tablet 1 tablet  1 tablet Oral Daily Comer Locket, MD   1 tablet at 07/17/21 0825   nicotine (NICODERM CQ - dosed in mg/24 hours) patch 14 mg  14 mg Transdermal Daily Massengill, Harrold Donath, MD       ondansetron (ZOFRAN-ODT) disintegrating tablet 4 mg  4 mg Oral Q6H PRN Comer Locket, MD       oxyCODONE (OXYCONTIN) 12 hr tablet 15 mg  15 mg Oral Q12H Nwoko, Nicole Kindred I, NP   15 mg at 07/17/21 0826   pantoprazole (PROTONIX) EC tablet 40 mg  40 mg Oral Daily Maryagnes Amos, FNP   40 mg at 07/17/21 0826   pregabalin (LYRICA) capsule 150 mg  150 mg Oral BID Armandina Stammer I, NP   150 mg at 07/17/21 0826   propranolol ER (INDERAL LA) 24 hr capsule 60 mg  60 mg Oral Daily Maryagnes Amos, FNP   60 mg at 07/17/21 0827   QUEtiapine (SEROQUEL) tablet 50 mg  50 mg Oral QHS Armandina Stammer I, NP   50 mg at 07/16/21 2116   rivaroxaban (XARELTO) tablet 20 mg  20 mg Oral QPC breakfast  Maryagnes Amos, FNP   20 mg at 07/17/21 8921   senna-docusate (Senokot-S) tablet 1 tablet  1 tablet Oral QHS Mariel Craft, MD       thiamine tablet 100 mg  100 mg Oral Daily Mason Jim, Amy E, MD   100 mg at 07/17/21 1941   traZODone (DESYREL) tablet 200 mg  200 mg Oral QHS PRN Comer Locket, MD   200 mg at 07/16/21 2116   [START ON 07/20/2021] venlafaxine XR (EFFEXOR-XR) 24 hr capsule 37.5 mg  37.5 mg Oral Q breakfast Comer Locket, MD       [START ON 07/18/2021] venlafaxine XR (EFFEXOR-XR) 24 hr capsule 75 mg  75 mg Oral Q breakfast Mason Jim, Amy E, MD       PTA Medications: Medications Prior to Admission  Medication Sig Dispense Refill Last Dose   albuterol (VENTOLIN HFA) 108 (90 Base) MCG/ACT inhaler Inhale 2 puffs into the lungs every 6 (six) hours as needed for wheezing.      atorvastatin (LIPITOR) 40 MG tablet Take 40 mg by mouth daily.      azelastine (ASTELIN) 0.1 % nasal spray Place 1-2 sprays into both nostrils 2 (two) times daily as needed for allergies.      cyclobenzaprine (FLEXERIL) 10 MG tablet Take 10 mg by mouth 3 (three) times daily.      doxepin (SINEQUAN) 25 MG capsule Take 25 mg by mouth at bedtime as needed (sleep).      eszopiclone (LUNESTA) 2 MG TABS tablet Take 2 mg by mouth at bedtime as needed for sleep.      famotidine (PEPCID) 20 MG tablet Take 20 mg by mouth 2 (two) times daily.      JARDIANCE 25 MG TABS tablet Take 25 mg by mouth daily.      LANTUS SOLOSTAR 100 UNIT/ML Solostar Pen Inject 30 Units into the skin at bedtime.      NOVOLOG FLEXPEN 100 UNIT/ML FlexPen Inject 0-12 Units into the skin 3 (three) times daily. 180-200=2; 201-250=5; 251-300=7; 301-350=10; OVER 350=12      omeprazole (PRILOSEC) 40 MG capsule Take 40 mg by mouth daily.      polyvinyl alcohol (LIQUIFILM TEARS) 1.4 % ophthalmic solution 1 drop as needed for dry eyes.      pregabalin (LYRICA)  150 MG capsule Take 150 mg by mouth 2 (two) times daily.      propranolol ER (INDERAL  LA) 60 MG 24 hr capsule Take 60 mg by mouth daily.      QUEtiapine (SEROQUEL) 25 MG tablet Take 25 mg by mouth at bedtime.      rivaroxaban (XARELTO) 20 MG TABS tablet Take 20 mg by mouth daily with supper. Pt reports "not taking as clotting isn't that bad" but per outpatient notes this is still prescribed. Last filled 90 day supply on 06/29/21      traZODone (DESYREL) 100 MG tablet Take 200 mg by mouth at bedtime as needed for sleep.      TRULICITY 1.5 MG/0.5ML SOPN Inject 1.5 mg into the skin once a week.      venlafaxine XR (EFFEXOR-XR) 150 MG 24 hr capsule Take 300 mg by mouth daily.      XTAMPZA ER 13.5 MG C12A Take 13.5 mg by mouth 2 (two) times daily.       Patient Stressors: Health problems   Marital or family conflict    Patient Strengths: Ability for insight  Forensic psychologist fund of knowledge  Motivation for treatment/growth   Treatment Modalities: Medication Management, Group therapy, Case management,  1 to 1 session with clinician, Psychoeducation, Recreational therapy.   Physician Treatment Plan for Primary Diagnosis: MDD (major depressive disorder), recurrent episode, severe (HCC) Long Term Goal(s): Improvement in symptoms so as ready for discharge   Short Term Goals: Ability to identify and develop effective coping behaviors will improve Compliance with prescribed medications will improve Ability to identify triggers associated with substance abuse/mental health issues will improve Ability to identify changes in lifestyle to reduce recurrence of condition will improve Ability to verbalize feelings will improve Ability to disclose and discuss suicidal ideas Ability to demonstrate self-control will improve  Medication Management: Evaluate patient's response, side effects, and tolerance of medication regimen.  Therapeutic Interventions: 1 to 1 sessions, Unit Group sessions and Medication administration.  Evaluation of Outcomes: Progressing  Physician  Treatment Plan for Secondary Diagnosis: Principal Problem:   MDD (major depressive disorder), recurrent episode, severe (HCC) Active Problems:   Chronic pain syndrome   Diabetes mellitus (HCC)   Essential hypertension with goal blood pressure less than 130/85   GAD (generalized anxiety disorder)   Hyperlipidemia LDL goal <100   Insomnia   Iron deficiency anemia   Obesity, Class I, BMI 30-34.9   Occasional tremors   GERD (gastroesophageal reflux disease)  Long Term Goal(s): Improvement in symptoms so as ready for discharge   Short Term Goals: Ability to identify and develop effective coping behaviors will improve Compliance with prescribed medications will improve Ability to identify triggers associated with substance abuse/mental health issues will improve Ability to identify changes in lifestyle to reduce recurrence of condition will improve Ability to verbalize feelings will improve Ability to disclose and discuss suicidal ideas Ability to demonstrate self-control will improve     Medication Management: Evaluate patient's response, side effects, and tolerance of medication regimen.  Therapeutic Interventions: 1 to 1 sessions, Unit Group sessions and Medication administration.  Evaluation of Outcomes: Progressing   RN Treatment Plan for Primary Diagnosis: MDD (major depressive disorder), recurrent episode, severe (HCC) Long Term Goal(s): Knowledge of disease and therapeutic regimen to maintain health will improve  Short Term Goals: Ability to disclose and discuss suicidal ideas, Ability to identify and develop effective coping behaviors will improve, and Compliance with prescribed medications will improve  Medication  Management: RN will administer medications as ordered by provider, will assess and evaluate patient's response and provide education to patient for prescribed medication. RN will report any adverse and/or side effects to prescribing provider.  Therapeutic  Interventions: 1 on 1 counseling sessions, Psychoeducation, Medication administration, Evaluate responses to treatment, Monitor vital signs and CBGs as ordered, Perform/monitor CIWA, COWS, AIMS and Fall Risk screenings as ordered, Perform wound care treatments as ordered.  Evaluation of Outcomes: Progressing   LCSW Treatment Plan for Primary Diagnosis: MDD (major depressive disorder), recurrent episode, severe (HCC) Long Term Goal(s): Safe transition to appropriate next level of care at discharge, Engage patient in therapeutic group addressing interpersonal concerns.  Short Term Goals: Engage patient in aftercare planning with referrals and resources, Increase social support, and Increase ability to appropriately verbalize feelings  Therapeutic Interventions: Assess for all discharge needs, 1 to 1 time with Social worker, Explore available resources and support systems, Assess for adequacy in community support network, Educate family and significant other(s) on suicide prevention, Complete Psychosocial Assessment, Interpersonal group therapy.  Evaluation of Outcomes: Progressing   Progress in Treatment: Attending groups: Yes. Participating in groups: Yes. Taking medication as prescribed: Yes. Toleration medication: Yes. Family/Significant other contact made: Yes, individual(s) contacted:  girlfriend Patient understands diagnosis: Yes. Discussing patient identified problems/goals with staff: Yes. Medical problems stabilized or resolved: Yes. Denies suicidal/homicidal ideation: Yes. Issues/concerns per patient self-inventory: No. Other: None  New problem(s) identified: No, Describe:  None  New Short Term/Long Term Goal(s):medication stabilization, elimination of SI thoughts, development of comprehensive mental wellness plan.   Patient Goals:  "work on my anger management, depression and learn some CBT stuff."  Discharge Plan or Barriers: Patient recently admitted. CSW will continue to  follow and assess for appropriate referrals and possible discharge planning.   Reason for Continuation of Hospitalization: Anxiety Depression Medication stabilization Suicidal ideation  Estimated Length of Stay: 3-5 days   Scribe for Treatment Team: Chrys Racer 07/17/2021 2:18 PM

## 2021-07-17 NOTE — Progress Notes (Signed)
The patient was cooperative, pleasant and compliant with medications. He does endorse depression but he denies SI &AVH. He had no new behavioral issues to report on shift.

## 2021-07-17 NOTE — [facility] Suicide Risk Assessment (Signed)
[facility] INPATIENT:  Family/Significant Other Suicide Prevention Education  Suicide Prevention Education:  Education Completed; Randa Lynn [phone] (Girlfriend) has been identified by the patient as the family member/significant other with whom the patient will be residing, and identified as the person(s) who will aid the patient in the event of a mental health crisis (suicidal ideations/suicide attempt).  With written consent from the patient, the family member/significant other has been provided the following suicide prevention education, prior to the and/or following the discharge of the patient.  The suicide prevention education provided includes the following: Suicide risk factors Suicide prevention and interventions National Suicide Hotline telephone number [facility] assessment telephone number [facility] Emergency Assistance 911 [facility] and/or Residential Mobile Crisis Unit telephone number  Request made of family/significant other to: Remove weapons (e.g., guns, rifles, knives), all items previously/currently identified as safety concern.   Remove drugs/medications (over-the-counter, prescriptions, illicit drugs), all items previously/currently identified as a safety concern.  The family member/significant other verbalizes understanding of the suicide prevention education information provided.  The family member/significant other agrees to remove the items of safety concern listed above.  CSW spoke with Ms. Ward who states that her boyfriend has been depressed for several months.  She states that he told her he took a whole bottle of Lunesta pills but she states that "he did not take but a few pills at the most".  She states "I believe he put most of them in his pocket".  She states that when she confronted him he acted like he was going to jump out of a window so she called the Police.  She states that when the Police arrived he ran from the home and hid from  them until he became "to cold and turned himself in".  Ms. Elesa Massed states that they recently moved and she believes that the stress from the move led to his depression and suicidal actions.  She states that there are no firearms or weapons in the home.  She states that he can return to the home after discharge and that she will continue to be a support for him.  CSW completed SPE with Ms. Elesa Massed.   Met[place] Kung Pedram Goodchild 07/17/2021, 2:13 PM

## 2021-07-18 LAB — GLUCOSE, CAPILLARY
Glucose-Capillary: 118 mg/dL — ABNORMAL HIGH (ref 70–99)
Glucose-Capillary: 106 mg/dL — ABNORMAL HIGH (ref 70–99)
Glucose-Capillary: 167 mg/dL — ABNORMAL HIGH (ref 70–99)
Glucose-Capillary: 149 mg/dL — ABNORMAL HIGH (ref 70–99)

## 2021-07-18 MED ORDER — POLYVINYL ALCOHOL 1.4 % OP SOLN: 1.0000 [drp] | OPHTHALMIC | Status: DC | PRN

## 2021-07-18 NOTE — [facility] Group Notes (Signed)
Goals Group °07/18/2021 ° ° °Group Focus: affirmation, clarity of thought, and goals/reality orientation °Treatment Modality:  Psychoeducation °Interventions utilized were assignment, group exercise, and support °Purpose: To be able to understand and verbalize the reason for their admission to the hospital. To understand that the medication helps with their chemical imbalance but they also need to work on their choices in life. To be challenged to develop a list of 30 positives about themselves. Also introduce the concept that "feelings" are not reality. ° °Participation Level:  did not attend ° °Tanner Mcgrath A °

## 2021-07-18 NOTE — Progress Notes (Signed)
Patient did not attend wrap up group. 

## 2021-07-18 NOTE — Progress Notes (Addendum)
[facility] Note  07/18/2021 12:20 PM Tanner LassRichard S Mcgrath  MRN:  [id number] Subjective:  "I have been feeling depressed and I took 4-5 tablet of my sleeping pill.  I did not intend to kill myself" Reason for hospitalization:Caucasian male with prior hx of mental illness & other chronic health conditions. [facility] complaint of suicide attempt by overdose on 5 tablets of Lunesta. Patient apparently on Valentine's day night informed his girlfriend that he has taken a whole a bottle of Lunesta tablets in a suicide attempt. Patient's girlfriend did call the ems to transport patient to the hospital for evaluation.  Today's note:  Records reviewed, Nursing report obtained and care was reviewed with members of our interdisciplinary team.  Patient was seen in his bed this morning for today's assessment.  He did not go for group therapy at the time because he was having headaches.  He  rated his depression today 4/10 with 10 being severe depression.  He also reported that Venlafaxine has not been effective for him.  We are cross tapering him off Venlafaxine with Cymbalta..  Patient admitted that before coming to the hospital he was having some irritability ands anger issue.  He has long hx of depression but have never had outpatient Psychiatry follow him.  He plans to engage in outpatient MH treatment and therapy.  He reported good appetite and sleep.  Patient remains on 15  minutes check and is compliant wit his medications.  He was advised to participate in group therapy -learn more about coping skills.  He denied SI/HI/AVH. Principal Problem: MDD (major depressive disorder), recurrent episode, severe (HCC) Diagnosis: Principal Problem:   MDD (major depressive disorder), recurrent episode, severe (HCC) Active Problems:   Chronic pain syndrome   Diabetes mellitus (HCC)   Essential hypertension with goal blood pressure less than 130/85   GAD (generalized anxiety disorder)   Hyperlipidemia  LDL goal <100   Insomnia   Iron deficiency anemia   Obesity, Class I, BMI 30-34.9   Occasional tremors   GERD (gastroesophageal reflux disease)  Past Psychiatric History: Major depressive disorder, GAD, questionable Bipolar disorder  Past Medical History:  Past Medical History:  Diagnosis Date   Diabetes mellitus without complication (HCC)    History reviewed. No pertinent surgical history. Family History: History reviewed. No pertinent family history. Family Psychiatric  History: Mother- Bipolar disorder  Social History:  Social History   Substance and Sexual Activity  Alcohol Use None     Social History   Substance and Sexual Activity  Drug Use Not Currently    Social History   Socioeconomic History   Marital status: Legally Separated    Spouse name: Not on file   Number of children: Not on file   Years of education: Not on file   Highest education level: Not on file  Occupational History   Not on file  Tobacco Use   Smoking status: Never   Smokeless tobacco: Current  Substance and Sexual Activity   Alcohol use: Not on file   Drug use: Not Currently   Sexual activity: Yes  Other Topics Concern   Not on file  Social History Narrative   Not on file   Social Determinants of Health   Financial Resource Strain: Not on file  Food Insecurity: Not on file  Transportation Needs: Not on file  Physical Activity: Not on file  Stress: Not on file  Social Connections: Not on file   Additional Social  History:           Single (has a girlfriend), has 1 daughter, lives in [place], [place], unemployed (disabled due to mental health/medical conditions).              Sleep: Good  Appetite:  Good  Current Medications: Current Facility-Administered Medications  Medication Dose Route Frequency Provider Last Rate Last Admin   acetaminophen (TYLENOL) tablet 650 mg  650 mg Oral Q6H PRN Starkes-Perry, Juel Burrow, FNP       albuterol (VENTOLIN HFA) 108 (90 Base)  MCG/ACT inhaler 2 puff  2 puff Inhalation Q6H PRN Nwoko, Agnes I, NP       alum & mag hydroxide-simeth (MAALOX/MYLANTA) 200-200-20 MG/5ML suspension 30 mL  30 mL Oral Q4H PRN Starkes-Perry, Juel Burrow, FNP       atorvastatin (LIPITOR) tablet 40 mg  40 mg Oral Daily Maryagnes Amos, FNP   40 mg at 07/18/21 0840   azelastine (ASTELIN) 0.1 % nasal spray 1 spray  1 spray Each Nare BID Maryagnes Amos, FNP   1 spray at 07/17/21 0826   cyclobenzaprine (FLEXERIL) tablet 10 mg  10 mg Oral TID PRN Comer Locket, MD   10 mg at 07/17/21 2217   DULoxetine (CYMBALTA) DR capsule 60 mg  60 mg Oral Daily Comer Locket, MD   60 mg at 07/18/21 0839   empagliflozin (JARDIANCE) tablet 25 mg  25 mg Oral Daily Comer Locket, MD   25 mg at 07/18/21 5615   ferrous sulfate tablet 325 mg  325 mg Oral QHS Mariel Craft, MD   325 mg at 07/17/21 2201   hydrocerin (EUCERIN) cream   Topical BID Comer Locket, MD   Given at 07/18/21 0841   hydrocortisone cream 1 %   Topical BID PRN Comer Locket, MD       hydrOXYzine (ATARAX) tablet 50 mg  50 mg Oral BID PRN Maryagnes Amos, FNP   50 mg at 07/16/21 1722   insulin aspart (novoLOG) injection 0-15 Units  0-15 Units Subcutaneous TID WC Maryagnes Amos, FNP   3 Units at 07/17/21 1730   insulin glargine-yfgn (SEMGLEE) injection 30 Units  30 Units Subcutaneous QHS Comer Locket, MD   30 Units at 07/17/21 2155   loperamide (IMODIUM) capsule 2-4 mg  2-4 mg Oral PRN Comer Locket, MD       risperiDONE (RISPERDAL M-TABS) disintegrating tablet 2 mg  2 mg Oral Q8H PRN Starkes-Perry, Juel Burrow, FNP       And   LORazepam (ATIVAN) tablet 1 mg  1 mg Oral PRN Starkes-Perry, Juel Burrow, FNP       And   ziprasidone (GEODON) injection 20 mg  20 mg Intramuscular PRN Starkes-Perry, Juel Burrow, FNP       LORazepam (ATIVAN) tablet 1 mg  1 mg Oral Q6H PRN Mason Jim, Amy E, MD       magnesium hydroxide (MILK OF MAGNESIA) suspension 30 mL  30 mL Oral Daily PRN  Starkes-Perry, Juel Burrow, FNP       melatonin tablet 6 mg  6 mg Oral QHS Nwoko, Nicole Kindred I, NP   6 mg at 07/17/21 2156   multivitamin with minerals tablet 1 tablet  1 tablet Oral Daily Comer Locket, MD   1 tablet at 07/18/21 0840   nicotine (NICODERM CQ - dosed in mg/24 hours) patch 14 mg  14 mg Transdermal Daily Phineas Inches, MD  ondansetron (ZOFRAN-ODT) disintegrating tablet 4 mg  4 mg Oral Q6H PRN Comer LocketSingleton, Amy E, MD       oxyCODONE (OXYCONTIN) 12 hr tablet 15 mg  15 mg Oral Q12H Nwoko, Agnes I, NP   15 mg at 07/18/21 0840   pantoprazole (PROTONIX) EC tablet 40 mg  40 mg Oral Daily Maryagnes AmosStarkes-Perry, Takia S, FNP   40 mg at 07/18/21 0840   pregabalin (LYRICA) capsule 150 mg  150 mg Oral BID Armandina StammerNwoko, Agnes I, NP   150 mg at 07/18/21 0839   propranolol ER (INDERAL LA) 24 hr capsule 60 mg  60 mg Oral Daily Maryagnes AmosStarkes-Perry, Takia S, FNP   60 mg at 07/18/21 0840   QUEtiapine (SEROQUEL) tablet 50 mg  50 mg Oral QHS Armandina StammerNwoko, Agnes I, NP   50 mg at 07/17/21 2157   rivaroxaban (XARELTO) tablet 20 mg  20 mg Oral QPC breakfast Maryagnes AmosStarkes-Perry, Takia S, FNP   20 mg at 07/18/21 [id number]   senna-docusate (Senokot-S) tablet 1 tablet  1 tablet Oral QHS Mariel CraftMaurer, Sheila M, MD   1 tablet at 07/17/21 2157   thiamine tablet 100 mg  100 mg Oral Daily Mason JimSingleton, Amy E, MD   100 mg at 07/18/21 0839   traZODone (DESYREL) tablet 200 mg  200 mg Oral QHS PRN Comer LocketSingleton, Amy E, MD   200 mg at 07/17/21 2158   [START ON 07/20/2021] venlafaxine XR (EFFEXOR-XR) 24 hr capsule 37.5 mg  37.5 mg Oral Q breakfast Mason JimSingleton, Amy E, MD       venlafaxine XR (EFFEXOR-XR) 24 hr capsule 75 mg  75 mg Oral Q breakfast Mason JimSingleton, Amy E, MD   75 mg at 07/18/21 [id number]    Lab Results:  Results for orders placed or performed during the hospital encounter of 07/16/21 (from the past 48 hour(s))  Glucose, capillary     Status: Abnormal   Collection Time: 07/16/21  4:53 PM  Result Value Ref Range   Glucose-Capillary 211 (H) 70 - 99 mg/dL    Comment:  Glucose reference range applies only to samples taken after fasting for at least 8 hours.   Comment 1 Notify RN   Glucose, capillary     Status: Abnormal   Collection Time: 07/16/21  8:50 PM  Result Value Ref Range   Glucose-Capillary 155 (H) 70 - 99 mg/dL    Comment: Glucose reference range applies only to samples taken after fasting for at least 8 hours.  Glucose, capillary     Status: Abnormal   Collection Time: 07/17/21  5:47 AM  Result Value Ref Range   Glucose-Capillary 120 (H) 70 - 99 mg/dL    Comment: Glucose reference range applies only to samples taken after fasting for at least 8 hours.   Comment 1 Notify RN    Comment 2 Document in Chart   Glucose, capillary     Status: Abnormal   Collection Time: 07/17/21 12:53 PM  Result Value Ref Range   Glucose-Capillary 163 (H) 70 - 99 mg/dL    Comment: Glucose reference range applies only to samples taken after fasting for at least 8 hours.   Comment 1 Notify RN   Glucose, capillary     Status: Abnormal   Collection Time: 07/17/21  5:25 PM  Result Value Ref Range   Glucose-Capillary 163 (H) 70 - 99 mg/dL    Comment: Glucose reference range applies only to samples taken after fasting for at least 8 hours.  Iron and TIBC     Status:  None   Collection Time: 07/17/21  6:40 PM  Result Value Ref Range   Iron 144 45 - 182 ug/dL   TIBC 751 700 - 174 ug/dL   Saturation Ratios 38 17.9 - 39.5 %   UIBC 233 ug/dL    Comment: Performed at [facility], [address] [place], [place] [address]  Ferritin     Status: None   Collection Time: 07/17/21  6:40 PM  Result Value Ref Range   Ferritin 52 24 - 336 ng/mL    Comment: Performed at [facility], [address] [place], [place] [address]  Glucose, capillary     Status: Abnormal   Collection Time: 07/17/21  9:16 PM  Result Value Ref Range   Glucose-Capillary 139 (H) 70 - 99 mg/dL    Comment: Glucose reference range applies only to samples taken  after fasting for at least 8 hours.  Glucose, capillary     Status: Abnormal   Collection Time: 07/18/21  6:15 AM  Result Value Ref Range   Glucose-Capillary 118 (H) 70 - 99 mg/dL    Comment: Glucose reference range applies only to samples taken after fasting for at least 8 hours.  Glucose, capillary     Status: Abnormal   Collection Time: 07/18/21 11:58 AM  Result Value Ref Range   Glucose-Capillary 106 (H) 70 - 99 mg/dL    Comment: Glucose reference range applies only to samples taken after fasting for at least 8 hours.    Blood Alcohol level:  Lab Results  Component Value Date   ETH 18 (H) 07/15/2021    Metabolic Disorder Labs: Lab Results  Component Value Date   HGBA1C 7.9 (H) 07/16/2021   MPG 180.03 07/16/2021   MPG 188.64 07/15/2021   No results found for: PROLACTIN Lab Results  Component Value Date   CHOL 144 07/16/2021   TRIG 202 (H) 07/16/2021   HDL 39 (L) 07/16/2021   CHOLHDL 3.7 07/16/2021   VLDL 40 07/16/2021   LDLCALC 65 07/16/2021    Physical Findings: AIMS:  , ,  ,  ,    CIWA:  CIWA-Ar Total: 0 COWS:     Musculoskeletal: Strength & Muscle Tone: within normal limits Gait & Station: normal Patient leans: N/A  Psychiatric Specialty Exam:  Presentation  General Appearance: Appropriate for Environment; Casual  Eye Contact:Fair  Speech:Clear and Coherent; Normal Rate  Speech Volume:Normal  Handedness:Right   Mood and Affect  Mood:Depressed  Affect:Congruent   Thought Process  Thought Processes:Coherent; Linear  Descriptions of Associations:Intact  Orientation:Full (Time, Place and Person)  Thought Content:Logical  History of Schizophrenia/Schizoaffective disorder:No  Duration of Psychotic Symptoms:Greater than six months  Hallucinations:Hallucinations: None  Ideas of Reference:None  Suicidal Thoughts:Suicidal Thoughts: No  Homicidal Thoughts:Homicidal Thoughts: No   Sensorium  Memory:Immediate Good; Recent Good; Remote  Good  Judgment:Poor  Insight:Fair   Executive Functions  Concentration:Good  Attention Span:Good  Recall:Good  Fund of Knowledge:Good  Language:Good   Psychomotor Activity  Psychomotor Activity:Psychomotor Activity: Normal   Assets  Assets:Communication Skills; Desire for Improvement; Financial Resources/Insurance; Resilience; Housing; Intimacy   Sleep  Sleep:Sleep: Good  Physical Exam: Physical Exam Constitutional:      Appearance: Normal appearance. He is obese.  HENT:     Head: Normocephalic and atraumatic.  Cardiovascular:     Rate and Rhythm: Normal rate.  Pulmonary:     Effort: Pulmonary effort is normal. No respiratory distress.  Musculoskeletal:        General:  Normal range of motion.  Neurological:     General: No focal deficit present.     Mental Status: He is alert and oriented to person, place, and time.   Review of Systems  Constitutional: Negative.   Respiratory: Negative.    Cardiovascular: Negative.   Gastrointestinal:  Positive for constipation, diarrhea and nausea.  Neurological:  Positive for sensory change (unsettled feeling with w/d from Effexor).  Psychiatric/Behavioral:  Positive for depression. Negative for hallucinations, memory loss, substance abuse and suicidal ideas. The patient is nervous/anxious and has insomnia (improved).   Blood pressure 99/76, pulse 82, temperature 98.4 F (36.9 C), temperature source Oral, resp. rate 18, height 6' (1.829 m), weight 101.4 kg, SpO2 100 %. Body mass index is 30.33 kg/m.   Treatment Plan Summary: Daily contact with patient to assess and evaluate symptoms and progress in treatment and Medication management  Diagnosis: MDD (major depressive disorder), recurrent episode, severe (HCC) Treatment Plan Summary: Daily contact with patient to assess and evaluate symptoms and progress in treatment. Scheduled medications/labs: - Continue duloxetine to 60 mg today for depression -Continue taper off  of extended release venlafaxine 75 mg on 2/18 and 2/19 then 37.5 mg on 07/20/2021 and 07/21/2021 -Continue Seroquel 50 mg at bedtime for mood stabilization -Continue ferrous sulfate  at bedtime for low iron storage/iron deficiency -Continue  senna-docusate  at bedtime for bowel regulation -Continue trazodone 200 mg at bedtime as needed for sleep -Continue melatonin 6 mg at bedtime for sleep -Continue to supply nicotine patch for smoking cessation -Continue CIWA protocol to include thiamine tablet and multiple vitamin, Ativan as needed, -Continue home medications for chronic medical conditions: Albuterol, Lipitor, Astelin, cyclobenzaprine, Jardiance, insulin glargine, melatonin, Xarelto, OxyContin, Protonix, Lyrica, Inderal LA  PRN's : -Unit PRN standard medications placed to include agitation protocol    Psychosocial: 1. Encouragement to attend group therapies. 2. Encourage patient to learn and implement coping strategies to avoid need for PRN medications. 3. Encouragement for medication compliance. 4. Disposition in progress, appreciate social work assistance-[facility] 5. Patient agreeable to family meeting. Earney Navy, NP-PMHNP-BC 07/18/2021, 12:20 PM

## 2021-07-18 NOTE — [facility] Group Notes (Signed)
Psychoeducational Group Note ° ° ° °Date:07/18/21 °Time: 1300-1400 ° ° ° °Purpose of Group: . The group focus' on teaching patients on how to identify their needs and their Life Skills:  A group where two lists are made. What people need and what are things that we do that are unhealthy. The lists are developed by the patients and it is explained that we often do the actions that are not healthy to get our list of needs met. ° °Goal:: to develop the coping skills needed to get their needs met ° °Participation Level:  did not attend ° °Tanner Mcgrath A ° °

## 2021-07-18 NOTE — Group Note (Signed)
LCSW Group Therapy Note  07/18/2021     Type of Therapy and Topic:  Group Therapy: Anger and Coping Skills  Participation Level:  Did Not Attend   Description of Group:   In this group, patients learned how to recognize the physical, cognitive, emotional, and behavioral responses they have to anger-provoking situations.  They identified how they usually or often react when angered, and learned how healthy and unhealthy coping skills work initially, but the unhealthy ones stop working.   They analyzed how their frequently-chosen coping skill is possibly beneficial and how it is possibly unhelpful.  The group discussed a variety of healthier coping skills that could help in resolving the actual issues, as well as how to go about planning for the the possibility of future similar situations.  Therapeutic Goals: Patients will identify one thing that makes them angry and how they feel emotionally and physically, what their thoughts are or tend to be in those situations, and what healthy or unhealthy coping mechanism they typically use Patients will identify how their coping technique works for them, as well as how it works against them. Patients will explore possible new behaviors to use in future anger situations. Patients will learn that anger itself is normal and cannot be eliminated, and that healthier coping skills can assist with resolving conflict rather than worsening situations.  Summary of Patient Progress:  The patient was invited to group, did not attend.  Therapeutic Modalities:   Cognitive Behavioral Therapy   Lynnell Chad  .

## 2021-07-18 NOTE — Progress Notes (Signed)
°   07/18/21 1000  Psych Admission Type (Psych Patients Only)  Admission Status Involuntary  Psychosocial Assessment  Patient Complaints Depression  Eye Contact Fair  Facial Expression Flat  Affect Depressed  Speech Soft  Interaction Assertive  Motor Activity Slow  Appearance/Hygiene Unremarkable  Behavior Characteristics Cooperative  Mood Depressed  Thought Process  Coherency WDL  Content WDL  Delusions WDL  Perception WDL  Hallucination None reported or observed  Judgment Poor  Confusion None  Danger to Self  Current suicidal ideation? Denies  Danger to Others  Danger to Others None reported or observed

## 2021-07-18 NOTE — Progress Notes (Signed)
Tanner Mcgrath sitting in the day room said he is complain about dayroom not being open. He do not like being out of the dayroom. Dawayne said he likes to stay in the dayroom. Selmer told [place] he glad he can tell her his complaints.He can not have water when he wants water to drink water. Marland Kitchen

## 2021-07-19 LAB — GLUCOSE, CAPILLARY
Glucose-Capillary: 103 mg/dL — ABNORMAL HIGH (ref 70–99)
Glucose-Capillary: 130 mg/dL — ABNORMAL HIGH (ref 70–99)
Glucose-Capillary: 148 mg/dL — ABNORMAL HIGH (ref 70–99)
Glucose-Capillary: 181 mg/dL — ABNORMAL HIGH (ref 70–99)

## 2021-07-19 MED ORDER — QUETIAPINE FUMARATE 100 MG PO TABS
100.0000 mg | ORAL_TABLET | Freq: Every day | ORAL | Status: DC
  Administered 2021-07-19: 100 mg via ORAL
  Filled 2021-07-19 (×4): qty 1

## 2021-07-19 NOTE — [facility] Group Notes (Signed)
Psychoeducational Group Note  Date:  07/19/2021 Time:  1300-1400   Group Topic/Focus: This is a continuation of the group from Saturday. Pt's have been asked to formulate a list of 30 positives about themselves. This list is to be read 2 times a day for 30 days, looking in a mirror. Changing patterns of negative self talk. Also discussed is the fact that there have been some people who hurt [place] in the past. We keep that memory alive within [place]. Ways to cope with this are discused   Participation Level:  Active  Participation Quality:  Appropriate  Affect:  Appropriate  Cognitive:  Oriented  Insight: Improving  Engagement in Group:  Engaged  Modes of Intervention:  Activity, Discussion, Education, and Support  Additional Comments:  Rates his energy at a 10/10. Participated fully in the group.  Dione Housekeeper

## 2021-07-19 NOTE — Progress Notes (Signed)
Patient was upset during shift change complaining about not being able to go into the dayroom. He was talking with one of the mhts and when informed that it was reflection time and visitation he became even more upset with having to go to his room. He could be heard at [place] door and threw his trash can across his room. Writer went down to ask him to calm down and he replied "or what" Writer explained to him that if has complaints about staffing or his care he will have the opportunity to address it on his survey. Writer had AC Ezequiel Essex to speak with him once she finished report.

## 2021-07-19 NOTE — Progress Notes (Signed)
°   07/19/21 2020  Psych Admission Type (Psych Patients Only)  Admission Status Involuntary  Psychosocial Assessment  Patient Complaints Other (Comment) (pt upset w/ writer feels he is being treated and talked to like a child, patient does not like following rules on unit and feels he needs to advocate for other male patients)  Eye Contact Brief  Facial Expression Anxious;Wide-eyed  Affect Anxious;Preoccupied;Irritable  Speech Argumentative;Logical/coherent  Interaction Assertive;Demanding;Dominating  Motor Activity Other (Comment)  Appearance/Hygiene Unremarkable  Behavior Characteristics Agressive verbally  Mood Anxious;Angry  Thought Process  Coherency Concrete thinking  Content Blaming others  Delusions None reported or observed  Perception WDL  Hallucination None reported or observed  Judgment Poor  Confusion None  Danger to Self  Current suicidal ideation? Denies  Self-Injurious Behavior No self-injurious ideation or behavior indicators observed or expressed   Agreement Not to Harm Self Yes  Description of Agreement verbally contracts  Danger to Others  Danger to Others None reported or observed

## 2021-07-19 NOTE — Progress Notes (Addendum)
[facility] MD Progress Note  07/19/2021 2:12 PM Tanner Mcgrath  MRN:  [id number] Subjective:  "I am starting to feel better since I started taking Cymbalta" Reason for hospitalization:Caucasian male with prior hx of mental illness & other chronic health conditions. [facility] with complaint of suicide attempt by overdose on 5 tablets of Lunesta. Patient apparently on Valentine's day night informed his girlfriend that he has taken a whole a bottle of Lunesta tablets in a suicide attempt. Patient's girlfriend did call the ems to transport patient to the hospital for evaluation.  Today's note:  Records reviewed, Nursing report obtained and care was reviewed with members of our interdisciplinary team.  Patient was seen for daily evaluation this afternoon.  He presented bright affect, reported that Cymbalta seems to manage his mood better than Venlafaxine.  We continue to taper off Venlafaxine.  He did Bipolar disorder MDQ questionnaire and he answered all the questions yes 17/17.  Seroquel is increased to improve mood stabilization.  Patient is participating in all group activities and is seen socializing with peers today.  He denied SI/HI/AVH and no mention of Paranoia.  Principal Problem: MDD (major depressive disorder), recurrent episode, severe (HCC) Diagnosis: Principal Problem:   MDD (major depressive disorder), recurrent episode, severe (HCC) Active Problems:   Chronic pain syndrome   Diabetes mellitus (HCC)   Essential hypertension with goal blood pressure less than 130/85   GAD (generalized anxiety disorder)   Hyperlipidemia LDL goal <100   Insomnia   Iron deficiency anemia   Obesity, Class I, BMI 30-34.9   Occasional tremors   GERD (gastroesophageal reflux disease)  Past Psychiatric History: Major depressive disorder, GAD, questionable Bipolar disorder  Past Medical History:  Past Medical History:  Diagnosis Date   Diabetes mellitus without complication (HCC)    History  reviewed. No pertinent surgical history. Family History: History reviewed. No pertinent family history. Family Psychiatric  History: Mother- Bipolar disorder  Social History:  Social History   Substance and Sexual Activity  Alcohol Use None     Social History   Substance and Sexual Activity  Drug Use Not Currently    Social History   Socioeconomic History   Marital status: Legally Separated    Spouse name: Not on file   Number of children: Not on file   Years of education: Not on file   Highest education level: Not on file  Occupational History   Not on file  Tobacco Use   Smoking status: Never   Smokeless tobacco: Current  Substance and Sexual Activity   Alcohol use: Not on file   Drug use: Not Currently   Sexual activity: Yes  Other Topics Concern   Not on file  Social History Narrative   Not on file   Social Determinants of Health   Financial Resource Strain: Not on file  Food Insecurity: Not on file  Transportation Needs: Not on file  Physical Activity: Not on file  Stress: Not on file  Social Connections: Not on file   Additional Social History:           Single (has a girlfriend), has 1 daughter, [place], [place], unemployed (disabled due to mental health/medical conditions).              Sleep: Good  Appetite:  Good  Current Medications: Current Facility-Administered Medications  Medication Dose Route Frequency Provider Last Rate Last Admin   acetaminophen (TYLENOL) tablet 650 mg  650 mg Oral Q6H  PRN Maryagnes Amos, FNP   650 mg at 07/19/21 1346   albuterol (VENTOLIN HFA) 108 (90 Base) MCG/ACT inhaler 2 puff  2 puff Inhalation Q6H PRN Nwoko, Nicole Kindred I, NP       alum & mag hydroxide-simeth (MAALOX/MYLANTA) 200-200-20 MG/5ML suspension 30 mL  30 mL Oral Q4H PRN Starkes-Perry, Juel Burrow, FNP       atorvastatin (LIPITOR) tablet 40 mg  40 mg Oral Daily Maryagnes Amos, FNP   40 mg at 07/19/21 0732   azelastine (ASTELIN) 0.1 %  nasal spray 1 spray  1 spray Each Nare BID Maryagnes Amos, FNP   1 spray at 07/19/21 0732   cyclobenzaprine (FLEXERIL) tablet 10 mg  10 mg Oral TID PRN Comer Locket, MD   10 mg at 07/19/21 1347   DULoxetine (CYMBALTA) DR capsule 60 mg  60 mg Oral Daily Comer Locket, MD   60 mg at 07/19/21 0732   empagliflozin (JARDIANCE) tablet 25 mg  25 mg Oral Daily Comer Locket, MD   25 mg at 07/19/21 9767   ferrous sulfate tablet 325 mg  325 mg Oral QHS Mariel Craft, MD   325 mg at 07/18/21 2208   hydrocerin (EUCERIN) cream   Topical BID Comer Locket, MD   Given at 07/19/21 3419   hydrocortisone cream 1 %   Topical BID PRN Comer Locket, MD       hydrOXYzine (ATARAX) tablet 50 mg  50 mg Oral BID PRN Maryagnes Amos, FNP   50 mg at 07/16/21 1722   insulin aspart (novoLOG) injection 0-15 Units  0-15 Units Subcutaneous TID WC Maryagnes Amos, FNP   2 Units at 07/19/21 1211   insulin glargine-yfgn (SEMGLEE) injection 30 Units  30 Units Subcutaneous QHS Comer Locket, MD   30 Units at 07/18/21 2209   loperamide (IMODIUM) capsule 2-4 mg  2-4 mg Oral PRN Comer Locket, MD       risperiDONE (RISPERDAL M-TABS) disintegrating tablet 2 mg  2 mg Oral Q8H PRN Starkes-Perry, Juel Burrow, FNP       And   LORazepam (ATIVAN) tablet 1 mg  1 mg Oral PRN Starkes-Perry, Juel Burrow, FNP       And   ziprasidone (GEODON) injection 20 mg  20 mg Intramuscular PRN Starkes-Perry, Juel Burrow, FNP       LORazepam (ATIVAN) tablet 1 mg  1 mg Oral Q6H PRN Mason Jim, Meaghann Choo E, MD       magnesium hydroxide (MILK OF MAGNESIA) suspension 30 mL  30 mL Oral Daily PRN Starkes-Perry, Juel Burrow, FNP       melatonin tablet 6 mg  6 mg Oral QHS Nwoko, Agnes I, NP   6 mg at 07/18/21 2207   multivitamin with minerals tablet 1 tablet  1 tablet Oral Daily Comer Locket, MD   1 tablet at 07/19/21 0732   nicotine (NICODERM CQ - dosed in mg/24 hours) patch 14 mg  14 mg Transdermal Daily Massengill, Nathan, MD        ondansetron (ZOFRAN-ODT) disintegrating tablet 4 mg  4 mg Oral Q6H PRN Comer Locket, MD       oxyCODONE (OXYCONTIN) 12 hr tablet 15 mg  15 mg Oral Q12H Nwoko, Nicole Kindred I, NP   15 mg at 07/19/21 0737   pantoprazole (PROTONIX) EC tablet 40 mg  40 mg Oral Daily Maryagnes Amos, FNP   40 mg at 07/19/21 3790   polyvinyl  alcohol (LIQUIFILM TEARS) 1.4 % ophthalmic solution 1 drop  1 drop Both Eyes PRN Mason Jim, Iceis Knab E, MD       pregabalin (LYRICA) capsule 150 mg  150 mg Oral BID Armandina Stammer I, NP   150 mg at 07/19/21 0733   propranolol ER (INDERAL LA) 24 hr capsule 60 mg  60 mg Oral Daily Maryagnes Amos, FNP   60 mg at 07/19/21 9622   QUEtiapine (SEROQUEL) tablet 100 mg  100 mg Oral QHS Onuoha, Josephine C, NP       rivaroxaban (XARELTO) tablet 20 mg  20 mg Oral QPC breakfast Maryagnes Amos, FNP   20 mg at 07/19/21 2979   senna-docusate (Senokot-S) tablet 1 tablet  1 tablet Oral QHS Mariel Craft, MD   1 tablet at 07/18/21 2207   thiamine tablet 100 mg  100 mg Oral Daily Mason Jim, Preethi Scantlebury E, MD   100 mg at 07/19/21 0734   traZODone (DESYREL) tablet 200 mg  200 mg Oral QHS PRN Comer Locket, MD   200 mg at 07/18/21 2208   [START ON 07/20/2021] venlafaxine XR (EFFEXOR-XR) 24 hr capsule 37.5 mg  37.5 mg Oral Q breakfast Comer Locket, MD        Lab Results:  Results for orders placed or performed during the hospital encounter of 07/16/21 (from the past 48 hour(s))  Glucose, capillary     Status: Abnormal   Collection Time: 07/17/21  5:25 PM  Result Value Ref Range   Glucose-Capillary 163 (H) 70 - 99 mg/dL    Comment: Glucose reference range applies only to samples taken after fasting for at least 8 hours.  Iron and TIBC     Status: None   Collection Time: 07/17/21  6:40 PM  Result Value Ref Range   Iron 144 45 - 182 ug/dL   TIBC 892 119 - 417 ug/dL   Saturation Ratios 38 17.9 - 39.5 %   UIBC 233 ug/dL    Comment: Performed at [facility], [address], [place], [place] [address]  Ferritin     Status: None   Collection Time: 07/17/21  6:40 PM  Result Value Ref Range   Ferritin 52 24 - 336 ng/mL    Comment: Performed at [facility], [address], [place], [place] [address]  Glucose, capillary     Status: Abnormal   Collection Time: 07/17/21  9:16 PM  Result Value Ref Range   Glucose-Capillary 139 (H) 70 - 99 mg/dL    Comment: Glucose reference range applies only to samples taken after fasting for at least 8 hours.  Glucose, capillary     Status: Abnormal   Collection Time: 07/18/21  6:15 AM  Result Value Ref Range   Glucose-Capillary 118 (H) 70 - 99 mg/dL    Comment: Glucose reference range applies only to samples taken after fasting for at least 8 hours.  Glucose, capillary     Status: Abnormal   Collection Time: 07/18/21 11:58 AM  Result Value Ref Range   Glucose-Capillary 106 (H) 70 - 99 mg/dL    Comment: Glucose reference range applies only to samples taken after fasting for at least 8 hours.  Glucose, capillary     Status: Abnormal   Collection Time: 07/18/21  5:06 PM  Result Value Ref Range   Glucose-Capillary 167 (H) 70 - 99 mg/dL    Comment: Glucose reference range applies only to samples taken after fasting for at least  8 hours.   Comment 1 Notify RN   Glucose, capillary     Status: Abnormal   Collection Time: 07/18/21  7:58 PM  Result Value Ref Range   Glucose-Capillary 149 (H) 70 - 99 mg/dL    Comment: Glucose reference range applies only to samples taken after fasting for at least 8 hours.  Glucose, capillary     Status: Abnormal   Collection Time: 07/19/21  6:02 AM  Result Value Ref Range   Glucose-Capillary 103 (H) 70 - 99 mg/dL    Comment: Glucose reference range applies only to samples taken after fasting for at least 8 hours.  Glucose, capillary     Status: Abnormal   Collection Time: 07/19/21 11:52 AM  Result Value Ref Range   Glucose-Capillary 130 (H) 70 - 99 mg/dL    Comment:  Glucose reference range applies only to samples taken after fasting for at least 8 hours.   Comment 1 Notify RN     Blood Alcohol level:  Lab Results  Component Value Date   ETH 18 (H) 07/15/2021    Metabolic Disorder Labs: Lab Results  Component Value Date   HGBA1C 7.9 (H) 07/16/2021   MPG 180.03 07/16/2021   MPG 188.64 07/15/2021   No results found for: PROLACTIN Lab Results  Component Value Date   CHOL 144 07/16/2021   TRIG 202 (H) 07/16/2021   HDL 39 (L) 07/16/2021   CHOLHDL 3.7 07/16/2021   VLDL 40 07/16/2021   LDLCALC 65 07/16/2021    Physical Findings: AIMS:  , ,  ,  ,    CIWA:  CIWA-Ar Total: 0 COWS:     Musculoskeletal: Strength & Muscle Tone: within normal limits Gait & Station: normal Patient leans: N/A  Psychiatric Specialty Exam:  Presentation  General Appearance: Appropriate for Environment; Casual; Neat  Eye Contact:Good  Speech:Clear and Coherent; Normal Rate  Speech Volume:Normal  Handedness:Right   Mood and Affect  Mood:Anxious  Affect:Congruent   Thought Process  Thought Processes:Coherent; Goal Directed; Linear  Descriptions of Associations:Intact  Orientation:Full (Time, Place and Person)  Thought Content:Logical  History of Schizophrenia/Schizoaffective disorder:No  Duration of Psychotic Symptoms:Greater than six months  Hallucinations:Hallucinations: None  Ideas of Reference:None  Suicidal Thoughts:Suicidal Thoughts: No  Homicidal Thoughts:Homicidal Thoughts: No   Sensorium  Memory:Immediate Good; Recent Good; Remote Good  Judgment:Good  Insight:Good   Executive Functions  Concentration:Good  Attention Span:Good  Recall:Good  Fund of Knowledge:Good  Language:Good   Psychomotor Activity  Psychomotor Activity:Psychomotor Activity: Normal   Assets  Assets:Communication Skills; Desire for Improvement; Financial Resources/Insurance; Housing; Intimacy   Sleep  Sleep:Sleep: Good  Physical  Exam Constitutional:      Appearance: Normal appearance. He is obese.  HENT:     Head: Normocephalic and atraumatic.  Cardiovascular:     Rate and Rhythm: Normal rate.  Pulmonary:     Effort: Pulmonary effort is normal. No respiratory distress.  Musculoskeletal:        General: Normal range of motion.  Neurological:     General: No focal deficit present.     Mental Status: He is alert and oriented to person, place, and time.   Review of Systems  Constitutional: Negative.   Respiratory: Negative.    Cardiovascular: Negative.   Neurological:  Positive for sensory change (unsettled feeling with w/d from Effexor).  Psychiatric/Behavioral:  Positive for depression. Negative for hallucinations, memory loss, substance abuse and suicidal ideas. Insomnia: improved.   Blood pressure 123/82, pulse 83, temperature 97.8 F (36.6  C), temperature source Oral, resp. rate 18, height 6' (1.829 m), weight 101.4 kg, SpO2 98 %. Body mass index is 30.33 kg/m.   Treatment Plan Summary: Daily contact with patient to assess and evaluate symptoms and progress in treatment and Medication management  Diagnosis: MDD (major depressive disorder), recurrent episode, severe (HCC) Treatment Plan Summary Scheduled medications/labs: - Continue duloxetine 60 mg today for depression  -Continue taper off of extended release venlafaxine 75 mg on 2/18 and 2/19 then 37.5 mg on 07/20/2021 and 07/21/2021- watching for discontinuation symptoms -Increase  Seroquel to 100 MG at bedtime for mood stabilization -Continue ferrous sulfate  at bedtime for low iron storage/iron deficiency/RLS -Continue  senna-docusate  at bedtime for bowel regulation -Continue trazodone 200 mg at bedtime as needed for sleep -Continue melatonin 6 mg at bedtime for sleep -Continue to supply nicotine patch for smoking cessation -Continue CIWA protocol to include thiamine tablet and multiple vitamin, Ativan as needed, monitoring for signs of alcohol  withdrawal -Continue home medications for chronic medical conditions: Albuterol, Lipitor, Astelin, cyclobenzaprine, Jardiance, insulin glargine,  Xarelto, OxyContin, Protonix, Lyrica, Inderal LA  PRN's : -Unit PRN standard medications placed to include agitation protocol    Psychosocial: 1. Encouragement to attend group therapies. 2. Encourage patient to learn and implement coping strategies to avoid need for PRN medications. 3. Encouragement for medication compliance. 4. Disposition in progress, appreciate social work assistance-[facility]. 5. Patient agreeable to family meeting. Earney Navy, NP-PMHNP-BC 07/19/2021, 2:12 PM  I have independently evaluated the patient during a face-to-face assessment on 07/19/21. I reviewed the patient's chart, and I participated in key portions of the service. I discussed the case with the APP, and I agree with the assessment and plan of care as documented in the APP's note.   I personally spent 20 minutes on the unit in direct patient care. The direct patient care time included face-to-face time with the patient, reviewing the patient's chart, communicating with other professionals, and coordinating care. Greater than 50% of this time was spent in counseling or coordinating care with the patient regarding goals of hospitalization, psycho-education, and discharge planning needs.  Bartholomew Crews, MD, Celene Skeen

## 2021-07-19 NOTE — Progress Notes (Addendum)
Patient presented with improving mood today- he has been visible in the milieu throughout the shift interacting well with peers, and observed attending groups. Per pt's self inventory, pt rated his depression, hopelessness and anxiety a 3/1/8, respectively. Pt reported that his goal today was to "be happy, not worry about stuff that goes on here. Work on myself, coping skills, and make others smile." Pt denies SI/HI and A/VH       07/19/21 1200  Psych Admission Type (Psych Patients Only)  Admission Status Involuntary  Psychosocial Assessment  Patient Complaints Anxiety  Eye Contact Fair  Facial Expression Flat  Affect Appropriate to circumstance  Speech Soft  Interaction Assertive  Motor Activity Slow  Appearance/Hygiene Unremarkable  Behavior Characteristics Cooperative  Mood Anxious  Thought Process  Coherency WDL  Content WDL  Delusions None reported or observed  Perception WDL  Hallucination None reported or observed  Judgment Poor  Confusion None  Danger to Self  Current suicidal ideation? Denies  Self-Injurious Behavior No self-injurious ideation or behavior indicators observed or expressed   Agreement Not to Harm Self Yes  Description of Agreement verbally contracts

## 2021-07-19 NOTE — Group Note (Signed)
[facility] LCSW Group Therapy Note  07/19/2021    Type of Therapy and Topic:  Group Therapy:  Adding Supports Including Yourself  Participation Level:  Active   Description of Group:   Patients in this group were introduced to the concept that additional supports including self-support are an essential part of recovery.  Patients listed their current healthy and unhealthy supports, and discussed the difference between the two.   The song "My Own Hero" was played and a group discussion ensued in which patients stated they could relate to the song which inspired them to realize they have be willing to help themselves in order to succeed, because other people cannot achieve sobriety or stability for them.  Parents were encouraged toward self-advocacy and self-support as part of their recovery.  Establishment of boundaries was described and demonstrated with group members expressing comprehension.  The importance of establishing supports for each need was discussed.  Therapeutic Goals: 1)  explain the difference between healthy and unhealthy supports and discuss what specific supports are currently in patients' lives 2)  demonstrate the importance of being a key part of one's own support system 3)  discuss the need for appropriate boundaries with supports 4)  elicit ideas from patients about adding supports to meet needs   Summary of Patient Progress:   The patient listed current healthy supports as his sister, girlfriend, best friend and best friend's family, and daughter and current unhealthy supports as his aunts who have always torn him down since his mother's death.  The patient participated fully throughout group and expressed benefit from the session.  Therapeutic Modalities:   Motivational Interviewing Activity  Maretta Los

## 2021-07-19 NOTE — [facility] Group Notes (Signed)
Adult Psychoeducational Group Not Date:  07/19/2021 Time:  0900-1045 Group Topic/Focus: PROGRESSIVE RELAXATION. A group where deep breathing is taught and tensing and relaxation muscle groups is used. Imagery is used as well.  Pts are asked to imagine 3 pillars that hold them up when they are not able to hold themselves up.  Participation Level:  Active  Participation Quality:  Appropriate  Affect:  Appropriate  Cognitive:  Oriented  Insight: Improving  Engagement in Group:  Engaged  Modes of Intervention:  Activity, Discussion, Education, and Support  Additional Comments:  Pt's pillars are, Jesus, his girlfriend, best friend and his family.   Dione Housekeeper

## 2021-07-19 NOTE — [facility] Group Notes (Signed)
[facility] Group Notes:  (Nursing/MHT/Case Management/Adjunct)  Date:  07/19/2021  Time:  9:15 PM  Type of Therapy:   Wrap-up  Participation Level:  Active  Participation Quality:  Appropriate  Affect:  Appropriate  Cognitive:  Appropriate  Insight:  Appropriate, Good, and Improving  Engagement in Group:  Developing/Improving  Modes of Intervention:  Discussion  Summary of Progress/Problems: PT attended group and was very positive. PT said he has enjoyed the dayroom being open because it keeps him out of his own thoughts.   Lorita Officer 07/19/2021, 9:15 PM

## 2021-07-20 DIAGNOSIS — F332 Major depressive disorder, recurrent severe without psychotic features: Secondary | ICD-10-CM

## 2021-07-20 DIAGNOSIS — F3130 Bipolar disorder, current episode depressed, mild or moderate severity, unspecified: Secondary | ICD-10-CM | POA: Diagnosis present

## 2021-07-20 LAB — GLUCOSE, CAPILLARY
Glucose-Capillary: 125 mg/dL — ABNORMAL HIGH (ref 70–99)
Glucose-Capillary: 162 mg/dL — ABNORMAL HIGH (ref 70–99)
Glucose-Capillary: 156 mg/dL — ABNORMAL HIGH (ref 70–99)

## 2021-07-20 MED ORDER — QUETIAPINE FUMARATE 100 MG PO TABS
150.0000 mg | ORAL_TABLET | Freq: Every day | ORAL | Status: DC
Start: 2021-07-20 — End: 2021-07-21
  Administered 2021-07-20: 150 mg via ORAL
  Filled 2021-07-20 (×3): qty 1

## 2021-07-20 MED ORDER — POLYETHYLENE GLYCOL 3350 17 G PO PACK: 17.0000 g | PACK | Freq: Every day | ORAL | Status: DC | PRN

## 2021-07-20 NOTE — Plan of Care (Signed)
Nurse discussed coping skills and anxiety with patient.  

## 2021-07-20 NOTE — [facility] Group Notes (Signed)
Spiritual care group on grief and loss facilitated by chaplains Amy Burris, Mdiv and Dyanne Carrel, [facility]   Group Goal:   Support / Education around grief and loss   Members engage in facilitated group support and psycho-social education.   Group Description:  Spiritual care group on grief and loss facilitated by chaplain Dyanne Carrel, [facility]   Group Goal:   Support / Education around grief and loss   Members engage in facilitated group support and psycho-social education.   Group Description:   Following introductions and group rules, group members engaged in facilitated group dialog and support around topic of loss, with particular support around experiences of loss in their lives. Group Identified types of loss (relationships / self / things) and identified patterns, circumstances, and changes that precipitate losses. Reflected on thoughts / feelings around loss, normalized grief responses, and recognized variety in grief experience. Group noted Worden's four tasks of grief in discussion.   Group drew on Adlerian / Rogerian, narrative, MI,   Patient Progress: Patient attended group and was attentive and engaged. Pt did not speak very much but demonstrated engagement through eye contact and body language as well as feedback offered the this chaplain after the group session ended.  Amy L/ Sophronia Simas, MDiv

## 2021-07-20 NOTE — Group Note (Signed)
[facility] LCSW Group Therapy Note   Group Date: 07/20/2021 Start Time: 1300 End Time: 1400   Type of Therapy/Topic:  Group Therapy:  Balance in Life  Participation Level:  Minimal   Description of Group:    This group will address the concept of balance and how it feels and looks when one is unbalanced. Patients will be encouraged to process areas in their lives that are out of balance, and identify reasons for remaining unbalanced. Facilitators will guide patients utilizing problem- solving interventions to address and correct the stressor making their life unbalanced. Understanding and applying boundaries will be explored and addressed for obtaining  and maintaining a balanced life. Patients will be encouraged to explore ways to assertively make their unbalanced needs known to significant others in their lives, using other group members and facilitator for support and feedback.  Therapeutic Goals: Patient will identify two or more emotions or situations they have that consume much of in their lives. Patient will identify signs/triggers that life has become out of balance:  Patient will identify two ways to set boundaries in order to achieve balance in their lives:  Patient will demonstrate ability to communicate their needs through discussion and/or role plays  Summary of Patient Progress:    Pt came in and out of group frequently throughout the session.    Therapeutic Modalities:   Cognitive Behavioral Therapy Solution-Focused Therapy Assertiveness Training   Mliss Fritz, [place]

## 2021-07-20 NOTE — Progress Notes (Addendum)
°   07/20/21 2007  Psych Admission Type (Psych Patients Only)  Admission Status Involuntary  Psychosocial Assessment  Patient Complaints Anxiety  Eye Contact Brief  Facial Expression Anxious  Affect Anxious  Speech Logical/coherent  Interaction Assertive  Motor Activity Slow  Appearance/Hygiene Unremarkable  Behavior Characteristics Cooperative;Appropriate to situation;Anxious  Mood Anxious  Thought Process  Coherency Circumstantial  Content Blaming others  Delusions WDL  Perception WDL  Hallucination None reported or observed  Judgment Poor  Confusion None  Danger to Self  Current suicidal ideation? Denies  Danger to Others  Danger to Others None reported or observed   Pt given 2000 meds. Pt endorses chronic back pain that is 10/10. Worked with pt to come up with pain control plan for tonight to help him get sleep. Pt denies SI, HI, AVH. Pt endorses anxiety 6/10 and denies depression. Pt says he was more anxious this morning because the dayroom was closed and he couldn't go in there. Pt says his pain is part of the reason for his anxiety. Pt also states that he has not had a bowel movement during this admission.

## 2021-07-20 NOTE — Progress Notes (Signed)
Patient stated his back pain was severe.  Oxycodone 15 mg given to patient at 8:00 pm   Night nurse will given more pain medicine later in the night.

## 2021-07-20 NOTE — Progress Notes (Signed)
Patient stated he wants to sign a 72 hr request for discharge.  Patient stated he needs to be discharged by Thursday because he has to be at home in order to receive some furniture and to see his grandchildren.  He feels that he is much better than he was.  Medication is working.  He feels that he will be ready to go by Thursday.   Has involuntary papers in his chart.

## 2021-07-20 NOTE — Group Note (Signed)
Recreation Therapy Group Note   Group Topic:Stress Management  Group Date: 07/20/2021 Start Time: 0930 End Time: 0950 Facilitators: Caroll Rancher, [place] Location: [address] Dayroom   Goal Area(s) Addresses:  Patient will identify positive stress management techniques. Patient will identify benefits of using stress management post d/c.  Group Description:  Meditation, Stretching.  LRT played a meditation that focused on positive thoughts while also leading the group through a series of stretches to help combat any negative thinking.  Patients were to listen and follow along as meditation played to fully engage.  LRT discussed with patients on were to get more stress management tools such as Youtube, Apps, yoga, etc.   Affect/Mood: Irritable   Participation Level: Minimal   Participation Quality: Independent   Behavior: Agitated and Apprehensive    Speech/Thought Process: Distracted and Focused   Insight: Good   Judgement: Good   Modes of Intervention: Meditation, Stretching   Patient Response to Interventions:  Receptive   Education Outcome:  Acknowledges education and In group clarification offered    Clinical Observations/Individualized Feedback: Pt came into group irritable and agitated because the dayroom wasn't open for patients to hang out due to lack of staffing.  Pt was also agitated with peer whom he felt was constantly complaining about the dayroom.  While in group, pt was able to settle down and seemed to be more focused.  Pt eventually laid head on the table for remainder of group.  Pt did not participate in the stretching.    Plan: Continue to engage patient in RT group sessions 2-3x/week.   Caroll Rancher, LRT,CTRS 07/20/2021 11:53 AM

## 2021-07-20 NOTE — Progress Notes (Addendum)
D:  Patient's self inventory sheet, patient sleeps good, sleep medication helpful.  Good appetite, normal energy level, poor  concentration.  Rated depression 1, denied hopeless, anxiety 6.  Denied withdrawals.  Denied SI.  Physical problems, pain, back, neck,  L leg, ankles,.  Worst pain #8 in past 24 hours.  Pain medicine helpful.   Goal is feel better physically.   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  Be able to help others.  MD knows issues.  Plans to be discharged home.   A:  Medications administered per MD orders.  Emotional support and encouragement given pt. R:  Denieed SI ad HI, contracts for safety.  Denied A/V hallucinations.  Safety maintained with 15 minute checks.

## 2021-07-20 NOTE — Progress Notes (Signed)
The patient attended the evening A.A.meeting and was appropriate.  

## 2021-07-20 NOTE — Progress Notes (Addendum)
[facility] MD Progress Note  07/20/2021 3:54 PM Tanner Mcgrath  MRN:  [id number]  Subjective:   Tanner Mcgrath is a 51 yr old male who presented on 2/15 to [facility] after a suicide attempt on Lunesta with worsening depression, he was admitted to [facility] on 2/16.  PPHx is significant for MDD, GAD, Bipolar Disorder, 3 prior suicide attempts via OD, remote history of hospitalization (10 yrs ago).  Case was discussed in the multidisciplinary team. MAR was reviewed and patient was compliant with medications.  He required PRN Tylenol, Flexeril, and Trazodone yesterday.  Psychiatric Team made the following recommendations yesterday: -Continue taper of Effexor last dose on 2/21 -Continue Cymbalta 60 mg daily -Increase Seroquel to 100 mg QHS   On interview today patient reports he slept well last night.  He reports his appetite is doing good.  He reports no SI, HI, or AVH.  He reports no Paranoia, Ideas of Reference, or other First Rank symptoms.  He reports no issues with his medications.  He states that he in fact thinks the medication changes have been very successful in controlling his symptoms.  He states that he has had no discontinuation symptoms from the taper off Effexor and onto Cymbalta.  He reports no RLS symptoms.  Asked again to describe previous high energy episodes.  He reports that he would have periods of time where he would feel invincible and talked fast/thought fast lasting 1-3 days.  He states that he would go a few days in a row without need for sleep, was more productive, and had increased libido.  He states that during these epidoses he would have too much energy and do tasks all around the house.  He states he would feel "10 feet tall and invincible."  He reports excessive spending in the past but he reports he has gotten better handle on this aspect over time.  Discussed with him that given these episodes in combination with his depressive episodes it does appear likely correct diagnosis is  bipolar disorder.  Discussed with him there is a danger of only having antidepressants and not a sufficiently strong mood stabilizer with bipolar disorder as the antidepressants can cause a manic episode.  Discussed that we would like to further increase his Seroquel.  He reports that he has had no symptoms of RLS since starting the iron supplement.  Discussed with him the importance of taking a stool softener while on iron because it can be constipating.  He reports that he does have issues with chronic constipation.  Discussed possibility of adding MiraLAX but he states he thinks he can go right now.  Discussed with him that if he is unable to to let nursing staff know and we will begin daily MiraLAX.  When asked if his girlfriend had brought his Trulicity he states that she has been feeling sick and so has been unable to so far.  He states that he normally takes it Monday or Tuesday and so as long as she is able to bring it tomorrow this should not be an issue.  Principal Problem: Bipolar affective disorder, current episode depressed (HCC) Diagnosis: Principal Problem:   Bipolar affective disorder, current episode depressed (HCC) Active Problems:   Chronic pain syndrome   Diabetes mellitus (HCC)   Essential hypertension   Hyperlipidemia   Insomnia   Obesity, Class I, BMI 30-34.9   Occasional tremors   GERD (gastroesophageal reflux disease)  Total Time spent with patient:  I personally spent 30 minutes on  the unit in direct patient care. The direct patient care time included face-to-face time with the patient, reviewing the patient's chart, communicating with other professionals, and coordinating care. Greater than 50% of this time was spent in counseling or coordinating care with the patient regarding goals of hospitalization, psycho-education, and discharge planning needs.   Past Psychiatric History: MDD, GAD, Bipolar Disorder, 3 prior suicide attempts via OD, remote history of  hospitalization (10 yrs ago).  Past Medical History:  Past Medical History:  Diagnosis Date   Diabetes mellitus without complication ([place])   Previous iron infusions with iron deficiency anemia Low testosterone Migraines RLS HTN Hyperlipidemia Chronic pain H/o PE/DVT with thrombophilia Essential tremors GERD  Family History: see H&P  Family Psychiatric  History: Mother- Bipolar Disorder  Social History:  Social History   Substance and Sexual Activity  Alcohol Use None     Social History   Substance and Sexual Activity  Drug Use Not Currently    Social History   Socioeconomic History   Marital status: Legally Separated    Spouse name: Not on file   Number of children: Not on file   Years of education: Not on file   Highest education level: Not on file  Occupational History   Not on file  Tobacco Use   Smoking status: Never   Smokeless tobacco: Current  Substance and Sexual Activity   Alcohol use: Not on file   Drug use: Not Currently   Sexual activity: Yes  Other Topics Concern   Not on file  Social History Narrative   Not on file   Social Determinants of Health   Financial Resource Strain: Not on file  Food Insecurity: Not on file  Transportation Needs: Not on file  Physical Activity: Not on file  Stress: Not on file  Social Connections: Not on file   Sleep: Good  Appetite:  Good  Current Medications: Current Facility-Administered Medications  Medication Dose Route Frequency Provider Last Rate Last Admin   acetaminophen (TYLENOL) tablet 650 mg  650 mg Oral Q6H PRN Suella Broad, FNP   650 mg at 07/20/21 1321   albuterol (VENTOLIN HFA) 108 (90 Base) MCG/ACT inhaler 2 puff  2 puff Inhalation Q6H PRN Nwoko, Agnes I, NP       alum & mag hydroxide-simeth (MAALOX/MYLANTA) 200-200-20 MG/5ML suspension 30 mL  30 mL Oral Q4H PRN Starkes-Perry, Gayland Curry, FNP       atorvastatin (LIPITOR) tablet 40 mg  40 mg Oral Daily Suella Broad, FNP    40 mg at 07/20/21 0813   azelastine (ASTELIN) 0.1 % nasal spray 1 spray  1 spray Each Nare BID Suella Broad, FNP   1 spray at 07/19/21 1729   cyclobenzaprine (FLEXERIL) tablet 10 mg  10 mg Oral TID PRN Harlow Asa, MD   10 mg at 07/20/21 1321   DULoxetine (CYMBALTA) DR capsule 60 mg  60 mg Oral Daily Nelda Marseille, Izaac Reisig E, MD   60 mg at 07/20/21 0815   empagliflozin (JARDIANCE) tablet 25 mg  25 mg Oral Daily Nelda Marseille, Brock Mokry E, MD   25 mg at 07/20/21 0813   ferrous sulfate tablet 325 mg  325 mg Oral QHS Lavella Hammock, MD   325 mg at 07/19/21 2204   hydrocerin (EUCERIN) cream   Topical BID Harlow Asa, MD   Given at 07/19/21 1621   hydrocortisone cream 1 %   Topical BID PRN Harlow Asa, MD  hydrOXYzine (ATARAX) tablet 50 mg  50 mg Oral BID PRN Suella Broad, FNP   50 mg at 07/16/21 1722   insulin aspart (novoLOG) injection 0-15 Units  0-15 Units Subcutaneous TID WC Suella Broad, FNP   2 Units at 07/20/21 0631   insulin glargine-yfgn (SEMGLEE) injection 30 Units  30 Units Subcutaneous QHS Harlow Asa, MD   30 Units at 07/19/21 2206   risperiDONE (RISPERDAL M-TABS) disintegrating tablet 2 mg  2 mg Oral Q8H PRN Suella Broad, FNP       And   LORazepam (ATIVAN) tablet 1 mg  1 mg Oral PRN Starkes-Perry, Gayland Curry, FNP       And   ziprasidone (GEODON) injection 20 mg  20 mg Intramuscular PRN Starkes-Perry, Gayland Curry, FNP       magnesium hydroxide (MILK OF MAGNESIA) suspension 30 mL  30 mL Oral Daily PRN Starkes-Perry, Gayland Curry, FNP       melatonin tablet 6 mg  6 mg Oral QHS Nwoko, Herbert Pun I, NP   6 mg at 07/19/21 2203   multivitamin with minerals tablet 1 tablet  1 tablet Oral Daily Harlow Asa, MD   1 tablet at 07/20/21 0813   nicotine (NICODERM CQ - dosed in mg/24 hours) patch 14 mg  14 mg Transdermal Daily Massengill, Ovid Curd, MD       oxyCODONE (OXYCONTIN) 12 hr tablet 15 mg  15 mg Oral Q12H Nwoko, Herbert Pun I, NP   15 mg at 07/20/21 0819    pantoprazole (PROTONIX) EC tablet 40 mg  40 mg Oral Daily Suella Broad, FNP   40 mg at 07/20/21 [id number]   polyvinyl alcohol (LIQUIFILM TEARS) 1.4 % ophthalmic solution 1 drop  1 drop Both Eyes PRN Nelda Marseille, Teddi Badalamenti E, MD       pregabalin (LYRICA) capsule 150 mg  150 mg Oral BID Lindell Spar I, NP   150 mg at 07/20/21 0820   propranolol ER (INDERAL LA) 24 hr capsule 60 mg  60 mg Oral Daily Suella Broad, FNP   60 mg at 07/20/21 0814   QUEtiapine (SEROQUEL) tablet 100 mg  100 mg Oral QHS Onuoha, Josephine C, NP   100 mg at 07/19/21 2204   rivaroxaban (XARELTO) tablet 20 mg  20 mg Oral QPC breakfast Suella Broad, FNP   20 mg at 07/20/21 0813   senna-docusate (Senokot-S) tablet 1 tablet  1 tablet Oral QHS Lavella Hammock, MD   1 tablet at 07/19/21 2204   thiamine tablet 100 mg  100 mg Oral Daily Nelda Marseille, Fedora Knisely E, MD   100 mg at 07/20/21 0816   traZODone (DESYREL) tablet 200 mg  200 mg Oral QHS PRN Viann Fish E, MD   200 mg at 07/19/21 2204   venlafaxine XR (EFFEXOR-XR) 24 hr capsule 37.5 mg  37.5 mg Oral Q breakfast Nelda Marseille, Lucion Dilger E, MD   37.5 mg at 07/20/21 [id number]    Lab Results:  Results for orders placed or performed during the hospital encounter of 07/16/21 (from the past 48 hour(s))  Glucose, capillary     Status: Abnormal   Collection Time: 07/18/21  5:06 PM  Result Value Ref Range   Glucose-Capillary 167 (H) 70 - 99 mg/dL    Comment: Glucose reference range applies only to samples taken after fasting for at least 8 hours.   Comment 1 Notify RN   Glucose, capillary     Status: Abnormal   Collection Time: 07/18/21  7:58  PM  Result Value Ref Range   Glucose-Capillary 149 (H) 70 - 99 mg/dL    Comment: Glucose reference range applies only to samples taken after fasting for at least 8 hours.  Glucose, capillary     Status: Abnormal   Collection Time: 07/19/21  6:02 AM  Result Value Ref Range   Glucose-Capillary 103 (H) 70 - 99 mg/dL    Comment: Glucose reference  range applies only to samples taken after fasting for at least 8 hours.  Glucose, capillary     Status: Abnormal   Collection Time: 07/19/21 11:52 AM  Result Value Ref Range   Glucose-Capillary 130 (H) 70 - 99 mg/dL    Comment: Glucose reference range applies only to samples taken after fasting for at least 8 hours.   Comment 1 Notify RN   Glucose, capillary     Status: Abnormal   Collection Time: 07/19/21  4:53 PM  Result Value Ref Range   Glucose-Capillary 148 (H) 70 - 99 mg/dL    Comment: Glucose reference range applies only to samples taken after fasting for at least 8 hours.   Comment 1 Notify RN   Glucose, capillary     Status: Abnormal   Collection Time: 07/19/21  7:38 PM  Result Value Ref Range   Glucose-Capillary 181 (H) 70 - 99 mg/dL    Comment: Glucose reference range applies only to samples taken after fasting for at least 8 hours.  Glucose, capillary     Status: Abnormal   Collection Time: 07/20/21  5:57 AM  Result Value Ref Range   Glucose-Capillary 125 (H) 70 - 99 mg/dL    Comment: Glucose reference range applies only to samples taken after fasting for at least 8 hours.  Glucose, capillary     Status: Abnormal   Collection Time: 07/20/21 11:48 AM  Result Value Ref Range   Glucose-Capillary 162 (H) 70 - 99 mg/dL    Comment: Glucose reference range applies only to samples taken after fasting for at least 8 hours.    Blood Alcohol level:  Lab Results  Component Value Date   ETH 18 (H) 99991111    Metabolic Disorder Labs: Lab Results  Component Value Date   HGBA1C 7.9 (H) 07/16/2021   MPG 180.03 07/16/2021   MPG 188.64 07/15/2021   No results found for: PROLACTIN Lab Results  Component Value Date   CHOL 144 07/16/2021   TRIG 202 (H) 07/16/2021   HDL 39 (L) 07/16/2021   CHOLHDL 3.7 07/16/2021   VLDL 40 07/16/2021   LDLCALC 65 07/16/2021    Physical Findings: CIWA:  CIWA-Ar Total: 0  Musculoskeletal: Strength & Muscle Tone: within normal  limits Gait & Station: normal Patient leans: N/A  Psychiatric Specialty Exam:  Presentation  General Appearance: Appropriate for Environment; Casual; Fairly Groomed (poor dentition)  Eye Contact:Good  Speech:Clear and Coherent; Normal Rate  Speech Volume:Normal  Handedness:Right   Mood and Affect  Mood:-- ("good")  Affect:Congruent; Appropriate   Thought Process  Thought Processes:Coherent; Goal Directed  Descriptions of Associations:Intact  Orientation:Full (Time, Place and Person)  Thought Content:Logical - denies AVH, paranoia, SI or HI  Hallucinations:Hallucinations: None  Ideas of Reference:None  Suicidal Thoughts:Suicidal Thoughts: No  Homicidal Thoughts:Homicidal Thoughts: No   Sensorium  Memory:Immediate Good; Recent Good  Judgment:Good  Insight:Good   Executive Functions  Concentration:Good  Attention Span:Good  [place] of Knowledge:Good  Language:Good   Psychomotor Activity  Psychomotor Activity:Psychomotor Activity: Normal   Assets  Assets:Communication Skills; Desire for  Improvement; Financial Resources/Insurance; Housing; Intimacy   Sleep  Total time unrecorded   Physical Exam Vitals and nursing note reviewed.  Constitutional:      General: He is not in acute distress.    Appearance: Normal appearance. He is obese. He is not ill-appearing or toxic-appearing.  HENT:     Head: Normocephalic and atraumatic.  Pulmonary:     Effort: Pulmonary effort is normal.  Musculoskeletal:        General: Normal range of motion.  Neurological:     General: No focal deficit present.     Mental Status: He is alert.   Review of Systems  Respiratory:  Negative for cough and shortness of breath.   Cardiovascular:  Negative for chest pain.  Gastrointestinal:  Positive for constipation (chronic). Negative for abdominal pain, diarrhea, nausea and vomiting.  Neurological:  Negative for dizziness, weakness and headaches.   Psychiatric/Behavioral:  Negative for depression, hallucinations and suicidal ideas. The patient is not nervous/anxious.   Blood pressure 131/88, pulse 81, temperature 97.7 F (36.5 C), temperature source Oral, resp. rate 18, height 6' (1.829 m), weight 101.4 kg, SpO2 99 %. Body mass index is 30.33 kg/m.   Treatment Plan Summary: Daily contact with patient to assess and evaluate symptoms and progress in treatment and Medication management  Yadriel Gregori is a 51 yr old male who presented on 2/15 to [facility] after a suicide attempt on Lunesta with worsening depression, he was admitted to [facility] on 2/16.  PPHx is significant for MDD, GAD, Bipolar Disorder, 3 prior suicide attempts via OD, remote history of hospitalization (10 yrs ago).  Adison has responded well thus far to the cross taper off Effexor and onto Cymbalta without any discontinuation symptoms.  Given his reports of previous manic/hypomanic episodes and based on MDQ,  we will be changing his diagnosis.  Given this and the need to ensure adequate mood stabilization we will further increase his Seroquel tonight.  He will be receiving his last dose of Effexor tomorrow morning and then he will be finished.  His girlfriend has not brought his Trulicity. Since he reports often taking it either Monday or Tuesday should be fine not getting it until tomorrow.  We will continue to monitor.   Bipolar Disorder MRE depressive - severe without psychotic features -Continue Cymbalta 60 mg daily - consider dose increase if develops discontinuation symptoms with continued taper off Effexor -Continue taper off Effexor XR current 37.5 mg last dose 2/21 -Increase Seroquel to 150 mg QHS tonight - monitoring for RLS with dose increase (A1c 7.9, Lipids WNL except for triglycerides 202 and HDL 39, QTC 421ms) -Continue Agitation Protocol: Risperdal/Ativan/Geodon  RLS/h/o iron deficiency anemia: -Continue Ferrous Sulfate 325 mg QHS with Colace daily and Miralax PRN -  Ferritin 52, Iron/TIBC WNL - will need PCP f/u after discharge  Essential Tremor: -Continue Propanolol ER 60 mg daily  Insomnia: -ContinueMelatonin 6 mg  QHS -Continue Trazodone 200 mg PRN QHS  R/o Alcohol use d/o: -Continue CIWA, last score 2/20 = 0 -Continue Thiamine 100 mg daily -Continue Multivitamin daily  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  Nicotine Dependence: -Continue Nicotine Patch 14 mg daily  Asthma: -Continue Albuterol 2 puffs q6 PRN  Hyperlipidemia: -Continue Lipitor 40 mg daily  H/o PE/DVT -Continue Xarelto 20 mg daily  Chronic Pain: -Continue Flexeril 10 mg TID PRN -Continue Oxycodone 15 mg  q12  -Continue PRN's: Tylenol, Maalox, Atarax, Milk of Jolaine Click, DO  07/20/2021, 3:54 PM

## 2021-07-21 DIAGNOSIS — F411 Generalized anxiety disorder: Secondary | ICD-10-CM

## 2021-07-21 DIAGNOSIS — F314 Bipolar disorder, current episode depressed, severe, without psychotic features: Secondary | ICD-10-CM

## 2021-07-21 LAB — GLUCOSE, CAPILLARY
Glucose-Capillary: 188 mg/dL — ABNORMAL HIGH (ref 70–99)
Glucose-Capillary: 131 mg/dL — ABNORMAL HIGH (ref 70–99)
Glucose-Capillary: 139 mg/dL — ABNORMAL HIGH (ref 70–99)
Glucose-Capillary: 146 mg/dL — ABNORMAL HIGH (ref 70–99)
Glucose-Capillary: 105 mg/dL — ABNORMAL HIGH (ref 70–99)

## 2021-07-21 MED ORDER — QUETIAPINE FUMARATE 100 MG PO TABS
100.0000 mg | ORAL_TABLET | Freq: Every day | ORAL | Status: DC
  Administered 2021-07-21: 100 mg via ORAL
  Filled 2021-07-21 (×3): qty 1

## 2021-07-21 MED ORDER — LORAZEPAM 1 MG PO TABS
1.0000 mg | ORAL_TABLET | Freq: Once | ORAL | Status: AC
  Administered 2021-07-21: 1 mg via ORAL
  Filled 2021-07-21: qty 1

## 2021-07-21 NOTE — Progress Notes (Signed)
[facility] MD Progress Note  07/21/2021 4:30 PM Tanner Mcgrath  MRN:  [id number]  Subjective:   Tanner Mcgrath is a 51 yr old male who presented on 2/15 to [facility] after a suicide attempt on Lunesta with worsening depression, he was admitted to [facility] on 2/16.  PPHx is significant for MDD, GAD, Bipolar Disorder, 3 prior suicide attempts via OD, remote history of hospitalization (10 yrs ago).  Case was discussed in the multidisciplinary team. MAR was reviewed and patient was compliant with medications.  He required PRN Tylenol, Flexeril, and Trazodone yesterday.  Psychiatric Team made the following recommendations yesterday: -Continue taper of Effexor last dose on 2/21 -Continue Cymbalta 60 mg daily -Increase Seroquel to 100 mg QHS   On interview today patient reports he slept "too good" last night and overslept this morning, and was overtired for a few hours. He asks to return to 100mg  of seroquel. He thinks that he should stay at this dose for a while as an outpatient and may go up to 150mg  at a later time. He states "I feel good enough" for discharge tomorrow. We discuss discharge plan and he is looking forward to it and appropriate follow up. Future orientation is good. Denies hallucinations and thoughts of harm to self or others. We discuss humor as a coping strategies. He is able to discuss his strengths with regards to social skills.    Principal Problem: Bipolar affective disorder, current episode depressed (HCC) Diagnosis: Principal Problem:   Bipolar affective disorder, current episode depressed (HCC) Active Problems:   Chronic pain syndrome   Diabetes mellitus (HCC)   Essential hypertension   Hyperlipidemia   Insomnia   Obesity, Class I, BMI 30-34.9   Occasional tremors   GERD (gastroesophageal reflux disease)   Past Psychiatric History: MDD, GAD, Bipolar Disorder, 3 prior suicide attempts via OD, remote history of hospitalization (10 yrs ago).  Past Medical History:  Past Medical  History:  Diagnosis Date   Diabetes mellitus without complication (HCC)   Previous iron infusions with iron deficiency anemia Low testosterone Migraines RLS HTN Hyperlipidemia Chronic pain H/o PE/DVT with thrombophilia Essential tremors GERD  Family History: see H&P  Family Psychiatric  History: Mother- Bipolar Disorder  Social History:  Social History   Substance and Sexual Activity  Alcohol Use None     Social History   Substance and Sexual Activity  Drug Use Not Currently    Social History   Socioeconomic History   Marital status: Legally Separated    Spouse name: Not on file   Number of children: Not on file   Years of education: Not on file   Highest education level: Not on file  Occupational History   Not on file  Tobacco Use   Smoking status: Never   Smokeless tobacco: Current  Substance and Sexual Activity   Alcohol use: Not on file   Drug use: Not Currently   Sexual activity: Yes  Other Topics Concern   Not on file  Social History Narrative   Not on file   Social Determinants of Health   Financial Resource Strain: Not on file  Food Insecurity: Not on file  Transportation Needs: Not on file  Physical Activity: Not on file  Stress: Not on file  Social Connections: Not on file   Sleep: Good  Appetite:  Good  Current Medications: Current Facility-Administered Medications  Medication Dose Route Frequency Provider Last Rate Last Admin   acetaminophen (TYLENOL) tablet 650 mg  650 mg Oral Q6H  PRN Maryagnes Amos, FNP   650 mg at 07/20/21 2154   albuterol (VENTOLIN HFA) 108 (90 Base) MCG/ACT inhaler 2 puff  2 puff Inhalation Q6H PRN Nwoko, Nicole Kindred I, NP       alum & mag hydroxide-simeth (MAALOX/MYLANTA) 200-200-20 MG/5ML suspension 30 mL  30 mL Oral Q4H PRN Starkes-Perry, Juel Burrow, FNP       atorvastatin (LIPITOR) tablet 40 mg  40 mg Oral Daily Maryagnes Amos, FNP   40 mg at 07/21/21 0820   azelastine (ASTELIN) 0.1 % nasal spray 1  spray  1 spray Each Nare BID Maryagnes Amos, FNP   1 spray at 07/19/21 1729   cyclobenzaprine (FLEXERIL) tablet 10 mg  10 mg Oral TID PRN Comer Locket, MD   10 mg at 07/20/21 2158   DULoxetine (CYMBALTA) DR capsule 60 mg  60 mg Oral Daily Comer Locket, MD   60 mg at 07/21/21 0820   empagliflozin (JARDIANCE) tablet 25 mg  25 mg Oral Daily Comer Locket, MD   25 mg at 07/21/21 0820   ferrous sulfate tablet 325 mg  325 mg Oral QHS Mariel Craft, MD   325 mg at 07/20/21 2155   hydrocerin (EUCERIN) cream   Topical BID Comer Locket, MD   Given at 07/20/21 1721   hydrocortisone cream 1 %   Topical BID PRN Comer Locket, MD       hydrOXYzine (ATARAX) tablet 50 mg  50 mg Oral BID PRN Maryagnes Amos, FNP   50 mg at 07/16/21 1722   insulin aspart (novoLOG) injection 0-15 Units  0-15 Units Subcutaneous TID WC Maryagnes Amos, FNP   2 Units at 07/21/21 1204   insulin glargine-yfgn (SEMGLEE) injection 30 Units  30 Units Subcutaneous QHS Comer Locket, MD   30 Units at 07/20/21 2156   risperiDONE (RISPERDAL M-TABS) disintegrating tablet 2 mg  2 mg Oral Q8H PRN Maryagnes Amos, FNP       And   LORazepam (ATIVAN) tablet 1 mg  1 mg Oral PRN Starkes-Perry, Juel Burrow, FNP       And   ziprasidone (GEODON) injection 20 mg  20 mg Intramuscular PRN Starkes-Perry, Juel Burrow, FNP       magnesium hydroxide (MILK OF MAGNESIA) suspension 30 mL  30 mL Oral Daily PRN Starkes-Perry, Juel Burrow, FNP       melatonin tablet 6 mg  6 mg Oral QHS Nwoko, Agnes I, NP   6 mg at 07/20/21 2155   multivitamin with minerals tablet 1 tablet  1 tablet Oral Daily Comer Locket, MD   1 tablet at 07/21/21 0820   nicotine (NICODERM CQ - dosed in mg/24 hours) patch 14 mg  14 mg Transdermal Daily Massengill, Harrold Donath, MD       oxyCODONE (OXYCONTIN) 12 hr tablet 15 mg  15 mg Oral Q12H Nwoko, Agnes I, NP   15 mg at 07/21/21 0819   pantoprazole (PROTONIX) EC tablet 40 mg  40 mg Oral Daily Maryagnes Amos, FNP   40 mg at 07/21/21 0820   polyethylene glycol (MIRALAX / GLYCOLAX) packet 17 g  17 g Oral Daily PRN Comer Locket, MD       polyvinyl alcohol (LIQUIFILM TEARS) 1.4 % ophthalmic solution 1 drop  1 drop Both Eyes PRN Mason Jim, Amy E, MD       pregabalin (LYRICA) capsule 150 mg  150 mg Oral BID Sanjuana Kava, NP  150 mg at 07/21/21 0820   propranolol ER (INDERAL LA) 24 hr capsule 60 mg  60 mg Oral Daily Maryagnes AmosStarkes-Perry, Takia S, FNP   60 mg at 07/21/21 0820   QUEtiapine (SEROQUEL) tablet 150 mg  150 mg Oral QHS Mason JimSingleton, Amy E, MD   150 mg at 07/20/21 2225   rivaroxaban (XARELTO) tablet 20 mg  20 mg Oral QPC breakfast Maryagnes AmosStarkes-Perry, Takia S, FNP   20 mg at 07/21/21 [id number]   senna-docusate (Senokot-S) tablet 1 tablet  1 tablet Oral QHS Mariel CraftMaurer, Sheila M, MD   1 tablet at 07/20/21 2155   thiamine tablet 100 mg  100 mg Oral Daily Mason JimSingleton, Amy E, MD   100 mg at 07/21/21 0820   traZODone (DESYREL) tablet 200 mg  200 mg Oral QHS PRN Comer LocketSingleton, Amy E, MD   200 mg at 07/19/21 2204    Lab Results:  Results for orders placed or performed during the hospital encounter of 07/16/21 (from the past 48 hour(s))  Glucose, capillary     Status: Abnormal   Collection Time: 07/19/21  4:53 PM  Result Value Ref Range   Glucose-Capillary 148 (H) 70 - 99 mg/dL    Comment: Glucose reference range applies only to samples taken after fasting for at least 8 hours.   Comment 1 Notify RN   Glucose, capillary     Status: Abnormal   Collection Time: 07/19/21  7:38 PM  Result Value Ref Range   Glucose-Capillary 181 (H) 70 - 99 mg/dL    Comment: Glucose reference range applies only to samples taken after fasting for at least 8 hours.  Glucose, capillary     Status: Abnormal   Collection Time: 07/20/21  5:57 AM  Result Value Ref Range   Glucose-Capillary 125 (H) 70 - 99 mg/dL    Comment: Glucose reference range applies only to samples taken after fasting for at least 8 hours.  Glucose, capillary      Status: Abnormal   Collection Time: 07/20/21 11:48 AM  Result Value Ref Range   Glucose-Capillary 162 (H) 70 - 99 mg/dL    Comment: Glucose reference range applies only to samples taken after fasting for at least 8 hours.  Glucose, capillary     Status: Abnormal   Collection Time: 07/20/21  4:44 PM  Result Value Ref Range   Glucose-Capillary 156 (H) 70 - 99 mg/dL    Comment: Glucose reference range applies only to samples taken after fasting for at least 8 hours.  Glucose, capillary     Status: Abnormal   Collection Time: 07/20/21  9:13 PM  Result Value Ref Range   Glucose-Capillary 188 (H) 70 - 99 mg/dL    Comment: Glucose reference range applies only to samples taken after fasting for at least 8 hours.   Comment 1 Notify RN   Glucose, capillary     Status: Abnormal   Collection Time: 07/21/21  6:13 AM  Result Value Ref Range   Glucose-Capillary 131 (H) 70 - 99 mg/dL    Comment: Glucose reference range applies only to samples taken after fasting for at least 8 hours.  Glucose, capillary     Status: Abnormal   Collection Time: 07/21/21 11:50 AM  Result Value Ref Range   Glucose-Capillary 139 (H) 70 - 99 mg/dL    Comment: Glucose reference range applies only to samples taken after fasting for at least 8 hours.    Blood Alcohol level:  Lab Results  Component Value Date   [facility]    Metabolic Disorder Labs: Lab Results  Component Value Date   HGBA1C 7.9 (H) 07/16/2021   MPG 180.03 07/16/2021   MPG 188.64 07/15/2021   No results found for: PROLACTIN Lab Results  Component Value Date   CHOL 144 07/16/2021   TRIG 202 (H) 07/16/2021   HDL 39 (L) 07/16/2021   CHOLHDL 3.7 07/16/2021   VLDL 40 07/16/2021   LDLCALC 65 07/16/2021    Physical Findings: CIWA:  CIWA-Ar Total: 2  Musculoskeletal: Strength & Muscle Tone: within normal limits Gait & Station: normal Patient leans: N/A  Psychiatric Specialty Exam:  Presentation  General Appearance: Appropriate  for Environment  Eye Contact:Fair  Speech:Normal Rate  Speech Volume:Normal  Handedness:Right   Mood and Affect  Mood:Anxious  Affect:Appropriate   Thought Process  Thought Processes:Coherent  Descriptions of Associations:Intact  Orientation:Full (Time, Place and Person)  Thought Content:Logical - denies AVH, paranoia, SI or HI  Hallucinations:Hallucinations: None  Ideas of Reference:None  Suicidal Thoughts:Suicidal Thoughts: No  Homicidal Thoughts:Homicidal Thoughts: No   Sensorium  Memory:Immediate Good  Judgment:Fair  Insight:Good   Executive Functions  Concentration:Fair  Attention Span:Fair  Recall:Fair  Fund of Knowledge:Fair  Language:Fair   Psychomotor Activity  Psychomotor Activity:Psychomotor Activity: Normal   Assets  Assets:Communication Skills   Sleep  Total time unrecorded   Physical Exam Vitals and nursing note reviewed.  Constitutional:      General: He is not in acute distress.    Appearance: Normal appearance. He is obese. He is not ill-appearing or toxic-appearing.  HENT:     Head: Normocephalic and atraumatic.  Eyes:     Extraocular Movements: Extraocular movements intact.  Pulmonary:     Effort: Pulmonary effort is normal.  Musculoskeletal:        General: Normal range of motion.     Cervical back: Normal range of motion.  Neurological:     General: No focal deficit present.     Mental Status: He is alert and oriented to person, place, and time.   Review of Systems  Respiratory:  Negative for cough and shortness of breath.   Cardiovascular:  Negative for chest pain.  Gastrointestinal:  Positive for constipation (chronic). Negative for abdominal pain, diarrhea, nausea and vomiting.  Neurological:  Negative for dizziness, weakness and headaches.  Psychiatric/Behavioral:  Negative for depression, hallucinations and suicidal ideas. The patient is not nervous/anxious.   Blood pressure (!) 122/98, pulse 79,  temperature 97.7 F (36.5 C), temperature source Oral, resp. rate 18, height 6' (1.829 m), weight 101.4 kg, SpO2 99 %. Body mass index is 30.33 kg/m.   Treatment Plan Summary: Daily contact with patient to assess and evaluate symptoms and progress in treatment and Medication management  Tanner Mcgrath is a 51 yr old male who [facility] on Lunesta with worsening depression, [facility].  PPHx is significant for MDD, GAD, Bipolar Disorder, 3 prior suicide attempts via OD, remote history of hospitalization (10 yrs ago).  Jame has responded well thus far to the cross taper off Effexor and onto Cymbalta without any discontinuation symptoms.    Bipolar Disorder MRE depressive - severe without psychotic features -Continue Cymbalta 60 mg daily - consider dose increase if develops discontinuation symptoms with continued taper off Effexor -Tapered Effexor XR last dose 2/21 -reduced Seroquel to 100 mg QHS tonight - mper patient preference (A1c 7.9, Lipids WNL except for triglycerides 202 and HDL 39, QTC  ) -Continue Agitation Protocol: Risperdal/Ativan/Geodon  RLS/h/o iron deficiency anemia: -Continue Ferrous Sulfate 325 mg QHS with Colace daily and Miralax PRN - Ferritin 52, Iron/TIBC WNL - will need PCP f/u after discharge  Essential Tremor: -Continue Propanolol ER 60 mg daily  Insomnia: -ContinueMelatonin 6 mg  QHS -Continue Trazodone 200 mg PRN QHS  R/o Alcohol use d/o: -Continue CIWA, last score 2/20 = 0 -Continue Thiamine 100 mg daily -Continue Multivitamin daily  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

## 2021-07-21 NOTE — Progress Notes (Signed)
Pt denies SI/HI/AVH and verbally agrees to approach staff if these become apparent or before harming themselves/others. Rates depression 0/10. Rates anxiety 3/10. Rates pain 6.5/10.  Scheduled medications administered to pt, per MD orders. RN provided support and encouragement to pt. Q15 min safety checks implemented and continued. Pt safe on the unit. RN will continue to monitor and intervene as needed.   07/21/21 0820  Psych Admission Type (Psych Patients Only)  Admission Status Involuntary  Psychosocial Assessment  Patient Complaints Anxiety;Other (Comment) (pain)  Eye Contact Brief  Facial Expression Anxious  Affect Anxious;Sad  Speech Logical/coherent;Soft  Interaction Assertive  Motor Activity Slow  Appearance/Hygiene Unremarkable  Behavior Characteristics Cooperative;Appropriate to situation;Anxious  Mood Anxious;Sad  Thought Process  Coherency Circumstantial  Content Blaming others  Delusions None reported or observed  Perception WDL  Hallucination None reported or observed  Judgment Poor  Confusion None  Danger to Self  Current suicidal ideation? Denies  Self-Injurious Behavior No self-injurious ideation or behavior indicators observed or expressed   Agreement Not to Harm Self Yes  Description of Agreement Verbal  Danger to Others  Danger to Others None reported or observed  Danger to Others Abnormal  Harmful Behavior to others No threats or harm toward other people  Destructive Behavior No threats or harm toward property

## 2021-07-21 NOTE — Progress Notes (Signed)
Since writer came in pt has been upset about the two dayrooms being open. Writer tried to explain that he was here to get better and not necessarily to mingle. Pt continued to get upset cursing stating the rules were "moronic"  . Pt continued to attack writer verbally . Pt got up and walked past Clinical research associate "get out my F...king way" , Clinical research associate preceded to continue to talk to other patients and pt came back to try to Leisure centre manager, and Clinical research associate informed pt that he specifically told Clinical research associate to stay out his business and he was informed to stay out of writers business. Pt later came to apologize and while writer was talking to pt , pt became irate when Clinical research associate would not respond the way pt wanted . Pt continued to get up yelling at writer , and when Clinical research associate asked what was being said that specifically upset pt, he would not specify. Pt continued to state he wanted to be moved to the [address] , while writer was trying to explain that he would have to talk to the doctor , he continued to yell at [employer].

## 2021-07-21 NOTE — Progress Notes (Signed)
°   07/21/21 2000  Psych Admission Type (Psych Patients Only)  Admission Status Involuntary  Psychosocial Assessment  Patient Complaints Anxiety  Eye Contact Fair  Facial Expression Anxious  Affect Anxious  Speech Logical/coherent  Interaction Assertive  Motor Activity Slow  Appearance/Hygiene Unremarkable  Behavior Characteristics Cooperative;Appropriate to situation;Anxious  Mood Anxious;Pleasant  Thought Process  Coherency Circumstantial  Content WDL  Delusions None reported or observed  Perception WDL  Hallucination None reported or observed  Judgment Poor  Confusion None  Danger to Self  Current suicidal ideation? Denies  Danger to Others  Danger to Others None reported or observed   Pt denies SI, HI, AVH. Pain still an issue. Rated pain 6/10. Rated anxiety 3/10 and denies depression. Says the day was good. Slept most of the day d/t sedation from increase in Seroquel last night. Pt agreeable to taking Tylenol and Flexeril tonight with nighttime meds to help manage his pain so he can get some rest.

## 2021-07-21 NOTE — Progress Notes (Signed)
Pt got upset about having to use the dayroom on his hall, [address]. Pt not satisfied that there was enough staff so both dayrooms are open. Did not agree with staff. Spoke with pt and explained rules. Pt states he is leaving tomorrow. Pt is trying to calm down. Encouraged pt to do deep breathing exercises. Contacted provider for medication for anxiety. Pt says that Vistaril does not work to reduce his anxiety. Will continue to monitor situation.

## 2021-07-21 NOTE — [facility] Group Notes (Signed)
Patient encouraged, but did not participate in reflection group.

## 2021-07-21 NOTE — Group Note (Signed)
Recreation Therapy Group Note   Group Topic:Animal Assisted Therapy   Group Date: 07/21/2021 Start Time: [id number] End Time: 1500 Facilitators: Victorino Sparrow, LRT,CTRS Location: [address]   AAA/T Program Assumption of Risk Form signed by Patient/ or Parent Legal Guardian YES  Patient is free of allergies or severe asthma  YES  Patient reports no fear of animals YES  Patient reports no history of cruelty to animals YES  Patient understands their participation is voluntary YES  Patient washes hands before animal contact YES  Patient washes hands after animal contact YES   Group Description: Patients provided opportunity to interact with trained and credentialed [employer] Therapy dog and the community volunteer/dog handler. Patients practiced appropriate animal interaction and were educated on dog safety outside of the hospital in common community settings. Patients were allowed to use dog toys and other items to practice commands, engage the dog in play, and/or complete routine aspects of animal care.   Education: Contractor, Pensions consultant, Communication & Social Skills    Affect/Mood: Appropriate   Participation Level: Engaged    Clinical Observations/Individualized Feedback: Pt attended and participated.  Pt asked questions of the volunteer about therapy dog's training and shared stories of her pets at home. Pt was appropriate during group session.     Plan: Continue to engage patient in RT group sessions 2-3x/week.   Victorino Sparrow, Glennis Brink 07/21/2021 3:50 PM

## 2021-07-21 NOTE — [facility] Group Notes (Signed)
Patient encouraged, but did not participate in orientation group.  °

## 2021-07-22 ENCOUNTER — Encounter (HOSPITAL_COMMUNITY): Payer: Self-pay

## 2021-07-22 LAB — GLUCOSE, CAPILLARY
Glucose-Capillary: 221 mg/dL — ABNORMAL HIGH (ref 70–99)
Glucose-Capillary: 135 mg/dL — ABNORMAL HIGH (ref 70–99)

## 2021-07-22 MED ORDER — DULOXETINE HCL 60 MG PO CPEP
60.0000 mg | ORAL_CAPSULE | Freq: Every day | ORAL | 0 refills | Status: DC
Start: 2021-07-23 — End: 2022-04-27

## 2021-07-22 MED ORDER — NICOTINE 14 MG/24HR TD PT24
14.0000 mg | MEDICATED_PATCH | Freq: Every day | TRANSDERMAL | 0 refills | Status: AC
Start: 2021-07-23 — End: 2021-07-30

## 2021-07-22 MED ORDER — QUETIAPINE FUMARATE 100 MG PO TABS: 100.0000 mg | ORAL_TABLET | Freq: Every day | ORAL | 0 refills | Status: DC

## 2021-07-22 MED ORDER — FERROUS SULFATE 325 (65 FE) MG PO TABS: 325.0000 mg | ORAL_TABLET | Freq: Every day | ORAL | 3 refills | Status: DC

## 2021-07-22 MED ORDER — MELATONIN 3 MG PO TABS: 6.0000 mg | ORAL_TABLET | Freq: Every day | ORAL | 0 refills | Status: AC

## 2021-07-22 MED ORDER — DULAGLUTIDE 1.5 MG/0.5ML ~~LOC~~ SOAJ
1.5000 mg | SUBCUTANEOUS | Status: DC
  Administered 2021-07-22: 1.5 mg via SUBCUTANEOUS

## 2021-07-22 MED ORDER — PANTOPRAZOLE SODIUM 40 MG PO TBEC: 40.0000 mg | DELAYED_RELEASE_TABLET | Freq: Every day | ORAL | 0 refills | Status: DC

## 2021-07-22 NOTE — [facility] Suicide Risk Assessment (Addendum)
Suicide Risk Assessment  Discharge Assessment    [facility] Discharge Suicide Risk Assessment   Principal Problem: Bipolar affective disorder, current episode depressed (HCC) Discharge Diagnoses: Principal Problem:   Bipolar affective disorder, current episode depressed (HCC) Active Problems:   Chronic pain syndrome   Diabetes mellitus (HCC)   Essential hypertension   Hyperlipidemia   Insomnia   Obesity, Class I, BMI 30-34.9   Occasional tremors   GERD (gastroesophageal reflux disease)  During the patient's hospitalization, patient had extensive initial psychiatric evaluation, and follow-up psychiatric evaluations every day.  Psychiatric diagnoses provided upon initial assessment: Bipolar disorder, depressed-type.  Patient's psychiatric medications were adjusted on admission: Started on Cymbalta and began scheduled taper off of Effexor.  His Seroquel was increased for Mood Stabilization and his Insomnia.  He was started on Melatonin for his Insomnia.  During the hospitalization, other adjustments were made to the patient's psychiatric medication regimen: He was started on Iron for his RLS.  Gradually, patient started adjusting to milieu.   Patient's care was discussed during the interdisciplinary team meeting every day during the hospitalization.  The patient reports not having side effects to prescribed psychiatric medication.  The patient reports their target psychiatric symptoms of SI, depression, anxiety, and Insomnia responded well to the psychiatric medications, and the patient reports overall benefit other psychiatric hospitalization. Supportive psychotherapy was provided to the patient. The patient also participated in regular group therapy while admitted.   Labs were reviewed with the patient, and abnormal results were discussed with the patient.  The patient denied having suicidal thoughts more than 48 hours prior to discharge.  Patient denies having homicidal thoughts.   Patient denies having auditory hallucinations.  Patient denies any visual hallucinations.  Patient denies having paranoid thoughts.  The patient is able to verbalize their individual safety plan to this provider.  It is recommended to the patient to continue psychiatric medications as prescribed, after discharge from the hospital.    It is recommended to the patient to follow up with your outpatient psychiatric provider and PCP.  Discussed with the patient, the impact of alcohol, drugs, tobacco have been there overall psychiatric and medical wellbeing, and total abstinence from substance use was recommended the patient.  Total Time spent with patient: 30 minutes  Musculoskeletal: Strength & Muscle Tone: within normal limits Gait & Station: normal Patient leans: N/A  Psychiatric Specialty Exam  Presentation  General Appearance: Appropriate for Environment; Casual; Fairly Groomed  Eye Contact:Good  Speech:Clear and Coherent; Normal Rate  Speech Volume:Normal  Handedness:Right   Mood and Affect  Mood:-- ("good")  Duration of Depression Symptoms: Greater than two weeks  Affect:Appropriate; Congruent   Thought Process  Thought Processes:Coherent; Goal Directed  Descriptions of Associations:Intact  Orientation:Full (Time, Place and Person)  Thought Content:Logical  History of Schizophrenia/Schizoaffective disorder:No  Duration of Psychotic Symptoms:Greater than six months  Hallucinations:Hallucinations: None  Ideas of Reference:None  Suicidal Thoughts:Suicidal Thoughts: No  Homicidal Thoughts:Homicidal Thoughts: No   Sensorium  Memory:Immediate Good; Recent Good  Judgment:Good  Insight:Good   Executive Functions  Concentration:Good  Attention Span:Good  Recall:Good  Fund of Knowledge:Good  Language:Good   Psychomotor Activity  Psychomotor Activity:Psychomotor Activity: Normal   Assets  Assets:Communication Skills; Resilience; Desire for  Improvement   Sleep  Sleep:Sleep: Good Number of Hours of Sleep: 6.75   Physical Exam: Physical Exam Vitals and nursing note reviewed.  Constitutional:      General: He is not in acute distress.    Appearance: Normal appearance. He is obese.  He is not ill-appearing or toxic-appearing.  HENT:     Head: Normocephalic and atraumatic.  Pulmonary:     Effort: Pulmonary effort is normal.  Musculoskeletal:        General: Normal range of motion.  Neurological:     General: No focal deficit present.     Mental Status: He is alert.   Review of Systems  Respiratory:  Negative for cough and shortness of breath.   Cardiovascular:  Negative for chest pain.  Gastrointestinal:  Negative for abdominal pain, constipation, diarrhea, nausea and vomiting.  Neurological:  Negative for dizziness, weakness and headaches.  Psychiatric/Behavioral:  Negative for depression, hallucinations and suicidal ideas. The patient is not nervous/anxious.   Blood pressure 107/77, pulse 92, temperature 97.7 F (36.5 C), temperature source Oral, resp. rate 18, height 6' (1.829 m), weight 101.4 kg, SpO2 100 %. Body mass index is 30.33 kg/m.  Mental Status Per Nursing Assessment::   On Admission:  Suicidal ideation indicated by patient  Demographic Factors:  Male and Caucasian  Loss Factors: Loss of significant relationship  Historical Factors: Prior suicide attempts, Family history of mental illness or substance abuse, and Impulsivity  Risk Reduction Factors:   Living with another person, especially a relative  Continued Clinical Symptoms:  Chronic Pain More than one psychiatric diagnosis Previous Psychiatric Diagnoses and Treatments Medical Diagnoses and Treatments/Surgeries  Cognitive Features That Contribute To Risk:  None    Suicide Risk:  Mild:  Patient does not have any SI but has attempted in the past.  There are no identifiable plans, no associated intent, mild dysphoria and related  symptoms, good self-control (both objective and subjective assessment), few other risk factors, and identifiable protective factors, including available and accessible social support.   Follow-up Information     [facility] Go on 08/04/2021.   Why: You have a hospital follow up appointment for medication management services on 08/04/21 at 10:40 am.  This appointment will be held in person. * Please arrive 30 minutes prior to your appointment and wear a mask. Contact information: [address] [place] [address] [phone]         [facility] Go on 07/23/2021.   Why: You have a hospital follow up appointment on 07/23/21 at 11:00 am for therapy services.  This appointment will be held in person.  Medication management services are also available. Contact information: [address] [place] [address] [phone]                 Plan Of Care/Follow-up recommendations:  Activity: as tolerated  Diet: heart healthy  Other: -Follow-up with your outpatient psychiatric provider -instructions on appointment date, time, and address (location) are provided to you in discharge paperwork.  -Take your psychiatric medications as prescribed at discharge - instructions are provided to you in the discharge paperwork  -Follow-up with outpatient primary care doctor and other specialists -for management of chronic medical disease, including: Diabetes, Chronic pain.  You will need routine monitoring of your A1c, Lipid Panel, CMP, CBC, and weight because you are on an anti-psychotic.  -Testing: Follow-up with outpatient provider for abnormal lab results: Elevated A1c and continued Diabetic care  -Recommend abstinence from alcohol, tobacco, and other illicit drug use at discharge.   -If your psychiatric symptoms recur, worsen, or if you have side effects to your psychiatric medications, call your outpatient psychiatric provider, 911, [phone] or go  to the nearest emergency department.  -  If suicidal thoughts recur, call your outpatient psychiatric provider, 911, [phone] or go to the nearest emergency department.   Lauro Franklin, MD 07/22/2021, 8:46 AM

## 2021-07-22 NOTE — Progress Notes (Signed)
°  [facility] Adult Case Management Discharge Plan :  Will you be returning to the same living situation after discharge:  Yes,  Home with Girlfriend  At discharge, do you have transportation home?: Yes,  Girlfriend  Do you have the ability to pay for your medications: Yes,  Medicare   Release of information consent forms completed and in the chart;  Patient's signature needed at discharge.  Patient to Follow up at:  [facility] Go on 08/04/2021.   Why: You have a hospital follow up appointment for medication management services on 08/04/21 at 10:40 am.  This appointment will be held in person. * Please arrive 30 minutes prior to your appointment and wear a mask. Contact information: [place] [address] [phone]         [facility] on 07/23/2021.   Why: You have a hospital follow up appointment on 07/23/21 at 11:00 am for therapy services.  This appointment will be held in person.  Medication management services are also available. Contact information: [place] [address] [phone]                 Next level of care provider has access to [place] and Suicide Prevention discussed: Yes,  with patient and girlfriend      Has patient been referred to the Quitline?: Patient refused referral  Patient has been referred for addiction treatment: N/A  Darleen Crocker, [place] 07/22/2021, 9:27 AM

## 2021-07-22 NOTE — Group Note (Signed)
Recreation Therapy Group Note   Group Topic:Stress Management  Group Date: 07/22/2021 Start Time: 0930 End Time: 0950 Facilitators: Caroll Rancher, LRT,CTRS Location: [address] Dayroom   Goal Area(s) Addresses:  Patient will actively participate in stress management techniques presented during session.  Patient will successfully identify benefit of practicing stress management post d/c.   Group Description: Guided Imagery. LRT provided education, instruction, and demonstration on practice of visualization via guided imagery. Patient was asked to participate in the technique introduced during session. LRT debriefed including topics of mindfulness, stress management and specific scenarios each patient could use these techniques. Patients were given suggestions of ways to access scripts post d/c and encouraged to explore Youtube and other apps available on smartphones, tablets, and computers.   Affect/Mood: N/A   Participation Level: N/A    Clinical Observations/Individualized Feedback: Unable to do group due to morning goals group running over time.    Plan: Continue to engage patient in RT group sessions 2-3x/week.   Caroll Rancher, LRT,CTRS 07/22/2021 1:11 PM

## 2021-07-22 NOTE — BH IP Treatment Plan (Signed)
Interdisciplinary Treatment and Diagnostic Plan Update  07/22/2021 Time of Session: 9:50am Tanner Mcgrath MRN: [id number]  Principal Diagnosis: Bipolar affective disorder, current episode depressed (HCC)  Secondary Diagnoses: Principal Problem:   Bipolar affective disorder, current episode depressed (HCC) Active Problems:   Chronic pain syndrome   Diabetes mellitus (HCC)   Essential hypertension   Hyperlipidemia   Insomnia   Obesity, Class I, BMI 30-34.9   Occasional tremors   GERD (gastroesophageal reflux disease)   Current Medications:  Current Facility-Administered Medications  Medication Dose Route Frequency Provider Last Rate Last Admin   acetaminophen (TYLENOL) tablet 650 mg  650 mg Oral Q6H PRN Maryagnes Amos, FNP   650 mg at 07/21/21 2108   albuterol (VENTOLIN HFA) 108 (90 Base) MCG/ACT inhaler 2 puff  2 puff Inhalation Q6H PRN Nwoko, Nicole Kindred I, NP       alum & mag hydroxide-simeth (MAALOX/MYLANTA) 200-200-20 MG/5ML suspension 30 mL  30 mL Oral Q4H PRN Starkes-Perry, Juel Burrow, FNP       atorvastatin (LIPITOR) tablet 40 mg  40 mg Oral Daily Maryagnes Amos, FNP   40 mg at 07/22/21 0825   azelastine (ASTELIN) 0.1 % nasal spray 1 spray  1 spray Each Nare BID Maryagnes Amos, FNP   1 spray at 07/22/21 0824   cyclobenzaprine (FLEXERIL) tablet 10 mg  10 mg Oral TID PRN Comer Locket, MD   10 mg at 07/21/21 2115   Dulaglutide SOPN 1.5 mg  1.5 mg Subcutaneous Weekly Melbourne Abts W, PA-C   1.5 mg at 07/22/21 2863   DULoxetine (CYMBALTA) DR capsule 60 mg  60 mg Oral Daily Bartholomew Crews E, MD   60 mg at 07/22/21 0825   empagliflozin (JARDIANCE) tablet 25 mg  25 mg Oral Daily Mason Jim, Amy E, MD   25 mg at 07/22/21 8177   ferrous sulfate tablet 325 mg  325 mg Oral QHS Mariel Craft, MD   325 mg at 07/21/21 2108   hydrocerin (EUCERIN) cream   Topical BID Comer Locket, MD   1 application at 07/22/21 1165   hydrocortisone cream 1 %   Topical BID PRN  Comer Locket, MD       hydrOXYzine (ATARAX) tablet 50 mg  50 mg Oral BID PRN Maryagnes Amos, FNP   50 mg at 07/16/21 1722   insulin aspart (novoLOG) injection 0-15 Units  0-15 Units Subcutaneous TID WC Maryagnes Amos, FNP   2 Units at 07/22/21 1255   insulin glargine-yfgn (SEMGLEE) injection 30 Units  30 Units Subcutaneous QHS Comer Locket, MD   30 Units at 07/21/21 2110   risperiDONE (RISPERDAL M-TABS) disintegrating tablet 2 mg  2 mg Oral Q8H PRN Maryagnes Amos, FNP       And   LORazepam (ATIVAN) tablet 1 mg  1 mg Oral PRN Starkes-Perry, Juel Burrow, FNP       And   ziprasidone (GEODON) injection 20 mg  20 mg Intramuscular PRN Starkes-Perry, Juel Burrow, FNP       magnesium hydroxide (MILK OF MAGNESIA) suspension 30 mL  30 mL Oral Daily PRN Starkes-Perry, Juel Burrow, FNP       melatonin tablet 6 mg  6 mg Oral QHS Nwoko, Agnes I, NP   6 mg at 07/21/21 2108   multivitamin with minerals tablet 1 tablet  1 tablet Oral Daily Comer Locket, MD   1 tablet at 07/22/21 0825   nicotine (NICODERM CQ - dosed in  mg/24 hours) patch 14 mg  14 mg Transdermal Daily Massengill, Nathan, MD       oxyCODONE (OXYCONTIN) 12 hr tablet 15 mg  15 mg Oral Q12H Nwoko, Agnes I, NP   15 mg at 07/22/21 0828   pantoprazole (PROTONIX) EC tablet 40 mg  40 mg Oral Daily Maryagnes Amos, FNP   40 mg at 07/22/21 0825   polyethylene glycol (MIRALAX / GLYCOLAX) packet 17 g  17 g Oral Daily PRN Comer Locket, MD       polyvinyl alcohol (LIQUIFILM TEARS) 1.4 % ophthalmic solution 1 drop  1 drop Both Eyes PRN Mason Jim, Amy E, MD       pregabalin (LYRICA) capsule 150 mg  150 mg Oral BID Armandina Stammer I, NP   150 mg at 07/22/21 2094   propranolol ER (INDERAL LA) 24 hr capsule 60 mg  60 mg Oral Daily Maryagnes Amos, FNP   60 mg at 07/22/21 0827   QUEtiapine (SEROQUEL) tablet 100 mg  100 mg Oral QHS Hill, Shelbie Hutching, MD   100 mg at 07/21/21 2108   rivaroxaban (XARELTO) tablet 20 mg  20 mg Oral  QPC breakfast Maryagnes Amos, FNP   20 mg at 07/22/21 0827   senna-docusate (Senokot-S) tablet 1 tablet  1 tablet Oral QHS Mariel Craft, MD   1 tablet at 07/21/21 2108   thiamine tablet 100 mg  100 mg Oral Daily Mason Jim, Amy E, MD   100 mg at 07/22/21 7096   traZODone (DESYREL) tablet 200 mg  200 mg Oral QHS PRN Comer Locket, MD   200 mg at 07/21/21 2108   PTA Medications: Medications Prior to Admission  Medication Sig Dispense Refill Last Dose   albuterol (VENTOLIN HFA) 108 (90 Base) MCG/ACT inhaler Inhale 2 puffs into the lungs every 6 (six) hours as needed for wheezing.      atorvastatin (LIPITOR) 40 MG tablet Take 40 mg by mouth daily.      azelastine (ASTELIN) 0.1 % nasal spray Place 1-2 sprays into both nostrils 2 (two) times daily as needed for allergies.      cyclobenzaprine (FLEXERIL) 10 MG tablet Take 10 mg by mouth 3 (three) times daily.      doxepin (SINEQUAN) 25 MG capsule Take 25 mg by mouth at bedtime as needed (sleep).      eszopiclone (LUNESTA) 2 MG TABS tablet Take 2 mg by mouth at bedtime as needed for sleep.      famotidine (PEPCID) 20 MG tablet Take 20 mg by mouth 2 (two) times daily.      JARDIANCE 25 MG TABS tablet Take 25 mg by mouth daily.      LANTUS SOLOSTAR 100 UNIT/ML Solostar Pen Inject 30 Units into the skin at bedtime.      NOVOLOG FLEXPEN 100 UNIT/ML FlexPen Inject 0-12 Units into the skin 3 (three) times daily. 180-200=2; 201-250=5; 251-300=7; 301-350=10; OVER 350=12      omeprazole (PRILOSEC) 40 MG capsule Take 40 mg by mouth daily.      polyvinyl alcohol (LIQUIFILM TEARS) 1.4 % ophthalmic solution 1 drop as needed for dry eyes.      pregabalin (LYRICA) 150 MG capsule Take 150 mg by mouth 2 (two) times daily.      propranolol ER (INDERAL LA) 60 MG 24 hr capsule Take 60 mg by mouth daily.      QUEtiapine (SEROQUEL) 25 MG tablet Take 25 mg by mouth at bedtime.  rivaroxaban (XARELTO) 20 MG TABS tablet Take 20 mg by mouth daily with supper.  Pt reports "not taking as clotting isn't that bad" but per outpatient notes this is still prescribed. Last filled 90 day supply on 06/29/21      traZODone (DESYREL) 100 MG tablet Take 200 mg by mouth at bedtime as needed for sleep.      TRULICITY 1.5 MG/0.5ML SOPN Inject 1.5 mg into the skin once a week.      venlafaxine XR (EFFEXOR-XR) 150 MG 24 hr capsule Take 300 mg by mouth daily.      XTAMPZA ER 13.5 MG C12A Take 13.5 mg by mouth 2 (two) times daily.       Patient Stressors: Health problems   Marital or family conflict    Patient Strengths: Ability for insight  Forensic psychologist fund of knowledge  Motivation for treatment/growth   Treatment Modalities: Medication Management, Group therapy, Case management,  1 to 1 session with clinician, Psychoeducation, Recreational therapy.   Physician Treatment Plan for Primary Diagnosis: Bipolar affective disorder, current episode depressed (HCC) Long Term Goal(s): Improvement in symptoms so as ready for discharge   Short Term Goals: Ability to identify and develop effective coping behaviors will improve Compliance with prescribed medications will improve Ability to identify triggers associated with substance abuse/mental health issues will improve Ability to identify changes in lifestyle to reduce recurrence of condition will improve Ability to verbalize feelings will improve Ability to disclose and discuss suicidal ideas Ability to demonstrate self-control will improve  Medication Management: Evaluate patient's response, side effects, and tolerance of medication regimen.  Therapeutic Interventions: 1 to 1 sessions, Unit Group sessions and Medication administration.  Evaluation of Outcomes: Adequate for Discharge  Physician Treatment Plan for Secondary Diagnosis: Principal Problem:   Bipolar affective disorder, current episode depressed (HCC) Active Problems:   Chronic pain syndrome   Diabetes mellitus (HCC)   Essential  hypertension   Hyperlipidemia   Insomnia   Obesity, Class I, BMI 30-34.9   Occasional tremors   GERD (gastroesophageal reflux disease)  Long Term Goal(s): Improvement in symptoms so as ready for discharge   Short Term Goals: Ability to identify and develop effective coping behaviors will improve Compliance with prescribed medications will improve Ability to identify triggers associated with substance abuse/mental health issues will improve Ability to identify changes in lifestyle to reduce recurrence of condition will improve Ability to verbalize feelings will improve Ability to disclose and discuss suicidal ideas Ability to demonstrate self-control will improve     Medication Management: Evaluate patient's response, side effects, and tolerance of medication regimen.  Therapeutic Interventions: 1 to 1 sessions, Unit Group sessions and Medication administration.  Evaluation of Outcomes: Adequate for Discharge   RN Treatment Plan for Primary Diagnosis: Bipolar affective disorder, current episode depressed (HCC) Long Term Goal(s): Knowledge of disease and therapeutic regimen to maintain health will improve  Short Term Goals: Ability to remain free from injury will improve, Ability to participate in decision making will improve, Ability to verbalize feelings will improve, Ability to disclose and discuss suicidal ideas, and Ability to identify and develop effective coping behaviors will improve  Medication Management: RN will administer medications as ordered by provider, will assess and evaluate patient's response and provide education to patient for prescribed medication. RN will report any adverse and/or side effects to prescribing provider.  Therapeutic Interventions: 1 on 1 counseling sessions, Psychoeducation, Medication administration, Evaluate responses to treatment, Monitor vital signs and CBGs as ordered, Perform/monitor CIWA,  COWS, AIMS and Fall Risk screenings as ordered,  Perform wound care treatments as ordered.  Evaluation of Outcomes: Adequate for Discharge   LCSW Treatment Plan for Primary Diagnosis: Bipolar affective disorder, current episode depressed (HCC) Long Term Goal(s): Safe transition to appropriate next level of care at discharge, Engage patient in therapeutic group addressing interpersonal concerns.  Short Term Goals: Engage patient in aftercare planning with referrals and resources, Increase social support, Increase emotional regulation, Facilitate acceptance of mental health diagnosis and concerns, Identify triggers associated with mental health/substance abuse issues, and Increase skills for wellness and recovery  Therapeutic Interventions: Assess for all discharge needs, 1 to 1 time with Social worker, Explore available resources and support systems, Assess for adequacy in community support network, Educate family and significant other(s) on suicide prevention, Complete Psychosocial Assessment, Interpersonal group therapy.  Evaluation of Outcomes: Adequate for Discharge   Progress in Treatment: Attending groups: Yes. Participating in groups: Yes. Taking medication as prescribed: Yes. Toleration medication: Yes. Family/Significant other contact made: Yes, individual(s) contacted:  girlfriend Patient understands diagnosis: Yes. Discussing patient identified problems/goals with staff: Yes. Medical problems stabilized or resolved: Yes. Denies suicidal/homicidal ideation: Yes. Issues/concerns per patient self-inventory: No. Other: None   New problem(s) identified: No, Describe:  None   New Short Term/Long Term Goal(s):medication stabilization, elimination of SI thoughts, development of comprehensive mental wellness plan.    Patient Goals:  "work on my anger management, depression and learn some CBT stuff."   Discharge Plan or Barriers: Patient recently admitted. Patient will return home with girlfriend and [facility]    Reason for Continuation of Hospitalization:  Medication stabilization    Estimated Length of Stay: Adequate for Discharge    Scribe for Treatment Team: Catha Browngel M Ronon Ferger, LCSWA 07/22/2021 1:59 PM

## 2021-07-22 NOTE — Discharge Summary (Signed)
Physician Discharge Summary Note  Patient:  Tanner Mcgrath is an 51 y.o., male MRN:  [id number] DOB:  Dec 24, 1970 Patient phone:  [phone] (home)  Patient address:   [address] [address],  Total Time spent with patient: 30 minutes  Date of Admission:  07/16/2021 Date of Discharge: 07/22/2021  Reason for Admission:   Tanner Mcgrath is a 51 yr old male who [facility] a suicide attempt on Lunesta with worsening depression, he was admitted to [facility] on 2/16.  PPHx is significant for MDD, GAD, Bipolar Disorder, 3 prior suicide attempts via OD, remote history of hospitalization (10 yrs ago).  Patient presents after a suicide attempt via ingestion of approximately 5 Lunesta tablets in the context of increasing anxiety, poor sleep, and worsening depressed mood.  States that he has been followed by his primary care provider for medication management but currently does not feel his combination of Effexor 300 mg daily and Seroquel 25 mg nightly are helping his mood or sleep.  He states he has previously tried and failed additional antidepressants including Prozac and has tried doxepin, trazodone and Lunesta with no improvement in sleep.  He states he has tried BuSpar and gabapentin as well as Vistaril in the past for anxiety with little benefit.  He endorses 3 remote suicide attempts in the past via overdose and states he has had remote psychiatric admissions but none in the last 10 years.  He states that in the last 2 weeks he has had anger issues, increased anxiety, ruminations, insomnia, difficulty with focus, and appetite change.  Principal Problem: Bipolar affective disorder, current episode depressed ([place]) Discharge Diagnoses: Principal Problem:   Bipolar affective disorder, current episode depressed ([place]) Active Problems:   Chronic pain syndrome   Diabetes mellitus ([place])   Essential hypertension   Hyperlipidemia   Insomnia   Obesity, Class I, BMI 30-34.9    Occasional tremors   GERD (gastroesophageal reflux disease)   Past Psychiatric History: MDD, GAD, Bipolar Disorder, 3 prior suicide attempts via OD, remote history of hospitalization (10 yrs ago).  Past Medical History:  Past Medical History:  Diagnosis Date   Diabetes mellitus without complication ([place])    History reviewed. No pertinent surgical history. Family History: History reviewed. No pertinent family history. Family Psychiatric  History: Mother- Bipolar Disorder Social History:  Social History   Substance and Sexual Activity  Alcohol Use None     Social History   Substance and Sexual Activity  Drug Use Not Currently    Social History   Socioeconomic History   Marital status: Legally Separated    Spouse name: Not on file   Number of children: Not on file   Years of education: Not on file   Highest education level: Not on file  Occupational History   Not on file  Tobacco Use   Smoking status: Never   Smokeless tobacco: Current  Substance and Sexual Activity   Alcohol use: Not on file   Drug use: Not Currently   Sexual activity: Yes  Other Topics Concern   Not on file  Social History Narrative   Not on file   Social Determinants of Health   Financial Resource Strain: Not on file  Food Insecurity: Not on file  Transportation Needs: Not on file  Physical Activity: Not on file  Stress: Not on file  Social Connections: Not on file    Hospital Course:    During the patient's hospitalization, patient had  extensive initial psychiatric evaluation, and follow-up psychiatric evaluations every day.  Psychiatric diagnoses provided upon initial assessment: Bipolar disorder, depressed-type.   Patient's psychiatric medications were adjusted on admission: Started on Cymbalta and began scheduled taper off of Effexor.  His Seroquel was increased for Mood Stabilization and his Insomnia.  He was started on Melatonin for his Insomnia.   During the hospitalization,  other adjustments were made to the patient's psychiatric medication regimen: He was started on Iron for his RLS.  Patient's care was discussed during the interdisciplinary team meeting every day during the hospitalization.  The patient reports not having side effects to prescribed psychiatric medication.  Gradually, patient started adjusting to milieu. The patient was evaluated each day by a clinical provider to ascertain response to treatment. Improvement was noted by the patient's report of decreasing symptoms, improved sleep and appetite, affect, medication tolerance, behavior, and participation in unit programming.  Patient was asked each day to complete a self inventory noting mood, mental status, pain, new symptoms, anxiety and concerns.   Symptoms were reported as significantly decreased or resolved completely by discharge.  The patient reports that their mood is stable.  The patient denied having suicidal thoughts for more than 48 hours prior to discharge.  Patient denies having homicidal thoughts.  Patient denies having auditory hallucinations.  Patient denies any visual hallucinations or other symptoms of psychosis.  The patient was motivated to continue taking medication with a goal of continued improvement in mental health.   The patient reports their target psychiatric symptoms of SI, depression, anxiety, and Insomnia responded well to the psychiatric medications, and the patient reports overall benefit other psychiatric hospitalization. Supportive psychotherapy was provided to the patient. The patient also participated in regular group therapy while hospitalized. Coping skills, problem solving as well as relaxation therapies were also part of the unit programming.  Labs were reviewed with the patient, and abnormal results were discussed with the patient.  The patient is able to verbalize their individual safety plan to this provider.  # It is recommended to the patient to continue  psychiatric medications as prescribed, after discharge from the hospital.    # It is recommended to the patient to follow up with your outpatient psychiatric provider and PCP.  # It was discussed with the patient, the impact of alcohol, drugs, tobacco have been there overall psychiatric and medical wellbeing, and total abstinence from substance use was recommended the patient.ed.  # Prescriptions provided or sent directly to preferred pharmacy at discharge. Patient agreeable to plan. Given opportunity to ask questions. Appears to feel comfortable with discharge.    # In the event of worsening symptoms, the patient is instructed to call the crisis hotline, 911 and or go to the nearest ED for appropriate evaluation and treatment of symptoms. To follow-up with primary care provider for other medical issues, concerns and or health care needs  # Patient was discharged home with a plan to follow up as noted below.    On day of discharge he reports that he is doing good.  He reports he slept well last night.  He reports his appetite is doing good.  He reports no SI, HI, or AVH.  He reports no [place], Ideas of Reference, or other [place] symptoms.  He reports no issues with his medications.  He reports that with the reduction in his Seroquel he has not been so sedated today.  Discussed with him the importance of follow up with his PCP for continued  Diabetic care.  Also discussed the need for monitoring A1c, Lipid Panel, CMP, CBC, and weight because he is on an antipsychotic.  Discussed with him what to do in the event of a future crisis.  Discussed that he can return to [facility], go to the [facility], go to the nearest ED, or call 911 or [phone].   He reported understanding and had no concerns. He was discharged home.    Physical Findings: AIMS: Facial and Oral Movements Muscles of Facial Expression: None, normal Lips and Perioral Area: None, normal Jaw: None, normal Tongue: None, normal,Extremity  Movements Upper (arms, wrists, hands, fingers): None, normal Lower (legs, knees, ankles, toes): None, normal, Trunk Movements Neck, shoulders, hips: None, normal, Overall Severity Severity of abnormal movements (highest score from questions above): None, normal Incapacitation due to abnormal movements: None, normal Patient's awareness of abnormal movements (rate only patient's report): No Awareness, Dental Status Current problems with teeth and/or dentures?: No Does patient usually wear dentures?: No  No Cogwheeling or Rigidity Present CIWA:  CIWA-Ar Total: 2   Musculoskeletal: Strength & Muscle Tone: within normal limits Gait & Station: normal Patient leans: N/A   Psychiatric Specialty Exam:  Presentation  General Appearance: Appropriate for Environment; Casual; Fairly Groomed  Eye Contact:Good  Speech:Clear and Coherent; Normal Rate  Speech Volume:Normal  Handedness:Right   Mood and Affect  Mood:-- ("good")  Affect:Appropriate; Congruent   Thought Process  Thought Processes:Coherent; Goal Directed  Descriptions of Associations:Intact  Orientation:Full (Time, Place and Person)  Thought Content:Logical  History of Schizophrenia/Schizoaffective disorder:No  Duration of Psychotic Symptoms:Greater than six months  Hallucinations:Hallucinations: None  Ideas of Reference:None  Suicidal Thoughts:Suicidal Thoughts: No  Homicidal Thoughts:Homicidal Thoughts: No   Sensorium  Memory:Immediate Good; Recent Good  Judgment:Good  Insight:Good   Executive Functions  Concentration:Good  Attention Span:Good  [place] of Knowledge:Good  Language:Good   Psychomotor Activity  Psychomotor Activity:Psychomotor Activity: Normal   Assets  Assets:Communication Skills; Resilience; Desire for Improvement   Sleep  Sleep:Sleep: Good Number of Hours of Sleep: 6.75    Physical Exam: Physical Exam Vitals and nursing note reviewed.   Constitutional:      General: He is not in acute distress.    Appearance: Normal appearance. He is obese. He is not ill-appearing or toxic-appearing.  HENT:     Head: Normocephalic and atraumatic.  Pulmonary:     Effort: Pulmonary effort is normal.  Musculoskeletal:        General: Normal range of motion.  Neurological:     General: No focal deficit present.     Mental Status: He is alert.   Review of Systems  Respiratory:  Negative for cough and shortness of breath.   Cardiovascular:  Negative for chest pain.  Gastrointestinal:  Negative for abdominal pain, constipation, diarrhea, nausea and vomiting.  Neurological:  Negative for dizziness, weakness and headaches.  Psychiatric/Behavioral:  Negative for depression, hallucinations and suicidal ideas. The patient is not nervous/anxious.   Blood pressure (!) 136/100, pulse 88, temperature 97.7 F (36.5 C), temperature source Oral, resp. rate 18, height 6' (1.829 m), weight 101.4 kg, SpO2 99 %. Body mass index is 30.33 kg/m.   Social History   Tobacco Use  Smoking Status Never  Smokeless Tobacco Current   Tobacco Cessation:  A prescription for an FDA-approved tobacco cessation medication provided at discharge   Blood Alcohol level:  Lab Results  Component Value Date   ETH 18 (H) 99991111    Metabolic Disorder Labs:  Lab Results  Component Value Date   HGBA1C 7.9 (H) 07/16/2021   MPG 180.03 07/16/2021   MPG 188.64 07/15/2021   No results found for: PROLACTIN Lab Results  Component Value Date   CHOL 144 07/16/2021   TRIG 202 (H) 07/16/2021   HDL 39 (L) 07/16/2021   CHOLHDL 3.7 07/16/2021   VLDL 40 07/16/2021   LDLCALC 65 07/16/2021    See Psychiatric Specialty Exam and Suicide Risk Assessment completed by Attending Physician prior to discharge.  Discharge destination:  Home  Is patient on multiple antipsychotic therapies at discharge:  No   Has Patient had three or more failed trials of antipsychotic  monotherapy by history:  No  Recommended Plan for Multiple Antipsychotic Therapies: NA  Discharge Instructions     Diet - low sodium heart healthy   Complete by: As directed    Increase activity slowly   Complete by: As directed       Allergies as of 07/22/2021       Reactions   Dextromethorphan-guaifenesin Anaphylaxis   Dextromethorphan specifically, causes "hard to breathe" and "Itching" Dextromethorphan specifically, causes "hard to breathe" and "Itching"   Buspirone Hcl Rash   rash   Ketamine    Other reaction(s): Other, Sweating (intolerance) Panic  Panic         Medication List     STOP taking these medications    doxepin 25 MG capsule Commonly known as: SINEQUAN   eszopiclone 2 MG Tabs tablet Commonly known as: LUNESTA   famotidine 20 MG tablet Commonly known as: PEPCID   omeprazole 40 MG capsule Commonly known as: PRILOSEC   traZODone 100 MG tablet Commonly known as: DESYREL   venlafaxine XR 150 MG 24 hr capsule Commonly known as: EFFEXOR-XR       TAKE these medications      Indication  albuterol 108 (90 Base) MCG/ACT inhaler Commonly known as: VENTOLIN HFA Inhale 2 puffs into the lungs every 6 (six) hours as needed for wheezing.  Indication: Asthma   atorvastatin 40 MG tablet Commonly known as: LIPITOR Take 40 mg by mouth daily.  Indication: High Amount of Fats in the Blood   azelastine 0.1 % nasal spray Commonly known as: ASTELIN Place 1-2 sprays into both nostrils 2 (two) times daily as needed for allergies.  Indication: Hayfever   cyclobenzaprine 10 MG tablet Commonly known as: FLEXERIL Take 10 mg by mouth 3 (three) times daily.  Indication: Muscle Spasm   DULoxetine 60 MG capsule Commonly known as: CYMBALTA Take 1 capsule (60 mg total) by mouth daily. Start taking on: July 23, 2021  Indication: Major Depressive Disorder   ferrous sulfate 325 (65 FE) MG tablet Take 1 tablet (325 mg total) by mouth at bedtime.   Indication: Restless Leg Syndrome   Jardiance 25 MG Tabs tablet Generic drug: empagliflozin Take 25 mg by mouth daily.  Indication: Type 2 Diabetes   Lantus SoloStar 100 UNIT/ML Solostar Pen Generic drug: insulin glargine Inject 30 Units into the skin at bedtime.  Indication: Type 2 Diabetes   melatonin 3 MG Tabs tablet Take 2 tablets (6 mg total) by mouth at bedtime.  Indication: Trouble Sleeping   nicotine 14 mg/24hr patch Commonly known as: NICODERM CQ - dosed in mg/24 hours Place 1 patch (14 mg total) onto the skin daily for 7 days. Start taking on: July 23, 2021  Indication: Nicotine Addiction   NovoLOG FlexPen 100 UNIT/ML FlexPen Generic drug: insulin aspart Inject 0-12 Units into the skin 3 (three) times  daily. 180-200=2; 201-250=5; 251-300=7; 301-350=10; OVER 350=12  Indication: Type 2 Diabetes   pantoprazole 40 MG tablet Commonly known as: PROTONIX Take 1 tablet (40 mg total) by mouth daily. Start taking on: July 23, 2021  Indication: Gastroesophageal Reflux Disease   polyvinyl alcohol 1.4 % ophthalmic solution Commonly known as: LIQUIFILM TEARS 1 drop as needed for dry eyes.  Indication: dry eyes   pregabalin 150 MG capsule Commonly known as: LYRICA Take 150 mg by mouth 2 (two) times daily.  Indication: Neuropathic Pain   propranolol ER 60 MG 24 hr capsule Commonly known as: INDERAL LA Take 60 mg by mouth daily.  Indication: High Blood Pressure Disorder   QUEtiapine 100 MG tablet Commonly known as: SEROQUEL Take 1 tablet (100 mg total) by mouth at bedtime. What changed:  medication strength how much to take  Indication: Manic-Depression   rivaroxaban 20 MG Tabs tablet Commonly known as: XARELTO Take 20 mg by mouth daily with supper. Pt reports "not taking as clotting isn't that bad" but per outpatient notes this is still prescribed. Last filled 90 day supply on 06/29/21  Indication: Clot Prevention   Trulicity 1.5 0000000 Sopn Generic  drug: Dulaglutide Inject 1.5 mg into the skin once a week.  Indication: Type 2 Diabetes   Xtampza ER 13.5 MG C12a Generic drug: oxyCODONE ER Take 13.5 mg by mouth 2 (two) times daily.  Indication: Chronic Pain        Follow-up [facility]. Go on 08/04/2021.   Why: You have a hospital follow up appointment for medication management services on 08/04/21 at 10:40 am.  This appointment will be held in person. * Please arrive 30 minutes prior to your appointment and wear a mask. Contact information: [address] [place] [address] [phone]         [facility] on 07/23/2021.   Why: You have a hospital follow up appointment on 07/23/21 at 11:00 am for therapy services.  This appointment will be held in person.  Medication management services are also available. Contact information: [address] [place] [address] [phone]                 Follow-up recommendations/Comments:   Activity: as tolerated   Diet: heart healthy   Other: -Follow-up with your outpatient psychiatric provider -instructions on appointment date, time, and address (location) are provided to you in discharge paperwork.   -Take your psychiatric medications as prescribed at discharge - instructions are provided to you in the discharge paperwork   -Follow-up with outpatient primary care doctor and other specialists -for management of chronic medical disease, including: Diabetes, Chronic pain.  You will need routine monitoring of your A1c, Lipid Panel, CMP, CBC, and weight because you are on an anti-psychotic.   -Testing: Follow-up with outpatient provider for abnormal lab results: Elevated A1c and continued Diabetic care   -Recommend abstinence from alcohol, tobacco, and other illicit drug use at discharge.    -If your psychiatric symptoms recur, worsen, or if you have side effects to your psychiatric medications, call your  outpatient psychiatric provider, 911, [phone] or go to the nearest emergency department.   -If suicidal thoughts recur, call your outpatient psychiatric provider, 911, [phone] or go to the nearest emergency department.   Signed: Briant Cedar, MD 07/22/2021, 6:12 PM

## 2021-07-22 NOTE — Progress Notes (Signed)
°   07/22/21 4166  Vital Signs  Pulse Rate 92  Pulse Rate Source Monitor  BP 107/77  BP Location Right Arm  BP Method Automatic  Patient Position (if appropriate) Standing  Oxygen Therapy  SpO2 100 %  Sleep  Number of Hours 6.75

## 2021-07-22 NOTE — Progress Notes (Signed)
D: Pt A & O X 3. Denies SI, HI, AVH and pain at this time. D/C home as ordered. Picked up in lobby by his girlfriend.   A: D/C instructions reviewed with pt including prescriptions and follow up appointments; compliance encouraged. All belongings from locker 42 returned to pt at time of departure. Scheduled medications administered with verbal education and effects monitored. Safety checks maintained without incident till time of d/c.  R: Pt receptive to care. Compliant with medications when offered. Denies adverse drug reactions when assessed. Verbalized understanding related to d/c instructions. Signed belonging sheet in agreement with items received from locker. Ambulatory with a steady gait. Appears to be in no physical distress at time of departure.

## 2021-08-10 ENCOUNTER — Other Ambulatory Visit: Payer: Self-pay | Admitting: Registered Nurse

## 2021-08-10 DIAGNOSIS — E1169 Type 2 diabetes mellitus with other specified complication: Secondary | ICD-10-CM

## 2021-08-10 DIAGNOSIS — R209 Unspecified disturbances of skin sensation: Secondary | ICD-10-CM

## 2021-08-12 ENCOUNTER — Ambulatory Visit
Admission: RE | Admit: 2021-08-12 | Discharge: 2021-08-12 | Disposition: A | Discharge status: Home / Self Care | Payer: Medicare HMO | Source: Ambulatory Visit | Attending: Registered Nurse | Admitting: Registered Nurse

## 2021-08-12 ENCOUNTER — Other Ambulatory Visit: Payer: Self-pay

## 2021-08-12 DIAGNOSIS — R209 Unspecified disturbances of skin sensation: Secondary | ICD-10-CM

## 2021-08-12 DIAGNOSIS — E1169 Type 2 diabetes mellitus with other specified complication: Secondary | ICD-10-CM

## 2021-08-18 ENCOUNTER — Encounter: Payer: Self-pay | Admitting: Gastroenterology

## 2021-08-24 ENCOUNTER — Other Ambulatory Visit: Payer: Self-pay

## 2021-08-24 ENCOUNTER — Emergency Department (HOSPITAL_BASED_OUTPATIENT_CLINIC_OR_DEPARTMENT_OTHER)
Admission: EM | Admit: 2021-08-24 | Discharge: 2021-08-24 | Disposition: A | Discharge status: Home / Self Care | Payer: Medicare HMO | Attending: Emergency Medicine | Admitting: Emergency Medicine

## 2021-08-24 ENCOUNTER — Encounter (HOSPITAL_BASED_OUTPATIENT_CLINIC_OR_DEPARTMENT_OTHER): Payer: Self-pay | Admitting: Emergency Medicine

## 2021-08-24 DIAGNOSIS — Z79899 Other long term (current) drug therapy: Secondary | ICD-10-CM | POA: Diagnosis not present

## 2021-08-24 DIAGNOSIS — E119 Type 2 diabetes mellitus without complications: Secondary | ICD-10-CM | POA: Diagnosis not present

## 2021-08-24 DIAGNOSIS — T185XXA Foreign body in anus and rectum, initial encounter: Secondary | ICD-10-CM | POA: Insufficient documentation

## 2021-08-24 DIAGNOSIS — J45909 Unspecified asthma, uncomplicated: Secondary | ICD-10-CM | POA: Insufficient documentation

## 2021-08-24 DIAGNOSIS — X58XXXA Exposure to other specified factors, initial encounter: Secondary | ICD-10-CM | POA: Insufficient documentation

## 2021-08-24 DIAGNOSIS — I10 Essential (primary) hypertension: Secondary | ICD-10-CM | POA: Diagnosis not present

## 2021-08-24 DIAGNOSIS — Z7901 Long term (current) use of anticoagulants: Secondary | ICD-10-CM | POA: Insufficient documentation

## 2021-08-24 DIAGNOSIS — K602 Anal fissure, unspecified: Secondary | ICD-10-CM

## 2021-08-24 DIAGNOSIS — K600 Acute anal fissure: Secondary | ICD-10-CM | POA: Diagnosis not present

## 2021-08-24 MED ORDER — LIDOCAINE HCL URETHRAL/MUCOSAL 2 % EX GEL
1.0000 "application " | Freq: Once | CUTANEOUS | Status: AC
  Administered 2021-08-24: 1 via TOPICAL
  Filled 2021-08-24: qty 11

## 2021-08-24 MED ORDER — LORAZEPAM 1 MG PO TABS
1.0000 mg | ORAL_TABLET | Freq: Once | ORAL | Status: AC
  Administered 2021-08-24: 1 mg via ORAL
  Filled 2021-08-24: qty 1

## 2021-08-24 NOTE — ED Triage Notes (Signed)
Pt arrives to ED with c/o rectal pain. Pt reports he has hx of constipation and was using a suppository for this. He states he used a suppository this morning but forgot to take the wrapper off before insertion. He states he is "cut up" and experiencing rectal bleeding.   ?

## 2021-08-24 NOTE — Discharge Instructions (Addendum)
You are going to have some bleeding over the next day or 2.  You can sit in a tub of warm water which may help with the discomfort.  You might want to start MiraLAX to help with the constipation as well. ?

## 2021-08-24 NOTE — ED Provider Notes (Signed)
?[facility] EMERGENCY DEPT ?Provider Note ? ? ?CSN: [id number] ?Arrival date & time: 08/24/21  7035 ? ?  ? ?History ? ?Chief Complaint  ?Patient presents with  ? Rectal Pain  ? ? ?Tanner Mcgrath is a 51 y.o. male. ? ?Patient is a 51 year old male with multiple medical problems including diabetes, asthma, depression, DVT and PE, hypertension, chronic opiate use for back pain and chronic constipation who is presenting today with a complaint of a rectal foreign body.  Patient reports that he has not been sleeping well at night he has had constipation which is an ongoing problem for him so he decided to use a suppository this morning.  He did not realize that he did not take the suppository out of the packaging and he inserted the entire thing into his rectum.  After he realized this it was becoming very uncomfortable and he tried to remove it with tweezers but just started to cause bleeding.  He is currently complaining of a pinching sensation in his rectum and discomfort and is requesting that the suppository be removed. ? ?The history is provided by the patient and the spouse.  ? ?  ? ?Home Medications ?Prior to Admission medications   ?Medication Sig Start Date End Date Taking? Authorizing Provider  ?albuterol (VENTOLIN HFA) 108 (90 Base) MCG/ACT inhaler Inhale 2 puffs into the lungs every 6 (six) hours as needed for wheezing. 06/17/21   [provider]  ?atorvastatin (LIPITOR) 40 MG tablet Take 40 mg by mouth daily. 06/16/21   [provider]  ?azelastine (ASTELIN) 0.1 % nasal spray Place 1-2 sprays into both nostrils 2 (two) times daily as needed for allergies.    [provider]  ?cyclobenzaprine (FLEXERIL) 10 MG tablet Take 10 mg by mouth 3 (three) times daily. 07/11/21   [provider]  ?DULoxetine (CYMBALTA) 60 MG capsule Take 1 capsule (60 mg total) by mouth daily. 07/23/21 08/22/21  Lauro Franklin, MD  ?ferrous sulfate 325 (65 FE) MG tablet Take 1 tablet  (325 mg total) by mouth at bedtime. 07/22/21   Lauro Franklin, MD  ?JARDIANCE 25 MG TABS tablet Take 25 mg by mouth daily. 07/12/21   [provider]  ?LANTUS SOLOSTAR 100 UNIT/ML Solostar Pen Inject 30 Units into the skin at bedtime. 07/12/21   [provider]  ?melatonin 3 MG TABS tablet Take 2 tablets (6 mg total) by mouth at bedtime. 07/22/21   Lauro Franklin, MD  ?NOVOLOG FLEXPEN 100 UNIT/ML FlexPen Inject 0-12 Units into the skin 3 (three) times daily. 180-200=2; 201-250=5; 251-300=7; 301-350=10; OVER 350=12 05/06/21   [provider]  ?pantoprazole (PROTONIX) 40 MG tablet Take 1 tablet (40 mg total) by mouth daily. 07/23/21 08/22/21  Lauro Franklin, MD  ?polyvinyl alcohol (LIQUIFILM TEARS) 1.4 % ophthalmic solution 1 drop as needed for dry eyes.    [provider]  ?pregabalin (LYRICA) 150 MG capsule Take 150 mg by mouth 2 (two) times daily. 06/16/21   [provider]  ?propranolol ER (INDERAL LA) 60 MG 24 hr capsule Take 60 mg by mouth daily. 05/20/21   [provider]  ?QUEtiapine (SEROQUEL) 100 MG tablet Take 1 tablet (100 mg total) by mouth at bedtime. 07/22/21 08/21/21  Lauro Franklin, MD  ?rivaroxaban (XARELTO) 20 MG TABS tablet Take 20 mg by mouth daily with supper. Pt reports "not taking as clotting isn't that bad" but per outpatient notes this is still prescribed. Last filled 90 day supply on  06/29/21    [provider]  ?TRULICITY 1.5 MG/0.5ML SOPN Inject 1.5 mg into the skin once a week. 05/21/21   [provider]  ?XTAMPZA ER 13.5 MG C12A Take 13.5 mg by mouth 2 (two) times daily. 06/22/21   [provider]  ?   ? ?Allergies    ?Dextromethorphan-guaifenesin, Buspirone hcl, Ketamine, Ambien [zolpidem], and Morphine   ? ?Review of Systems   ?Review of Systems ? ?Physical Exam ?Updated Vital Signs ?BP (!) 140/92   Pulse 81   Temp 98.6 ?F (37 ?C)   Resp 16   Ht 6' (1.829 m)   Wt 108.9 kg   SpO2 98%    BMI 32.55 kg/m?  ?Physical Exam ?Vitals and nursing note reviewed. Exam conducted with a chaperone present.  ?Constitutional:   ?   Comments: Anxious  ?HENT:  ?   Head: Normocephalic.  ?Cardiovascular:  ?   Rate and Rhythm: Normal rate.  ?Pulmonary:  ?   Effort: Pulmonary effort is normal.  ?Abdominal:  ?   General: Abdomen is flat. There is no distension.  ?   Palpations: Abdomen is soft.  ?   Tenderness: There is no abdominal tenderness.  ?Genitourinary: ?   Comments: Multiple anal tears noted with bleeding.  On rectal exam foreign body palpated ?Skin: ?   General: Skin is warm.  ?Neurological:  ?   Mental Status: He is alert. Mental status is at baseline.  ?Psychiatric:     ?   Mood and Affect: Mood normal.  ? ? ?ED Results / Procedures / Treatments   ?Labs ?(all labs ordered are listed, but only abnormal results are displayed) ?Labs Reviewed - No data to display ? ?EKG ?None ? ?Radiology ?No results found. ? ?Procedures ?Marland KitchenForeign Body Removal ? ?Date/Time: 08/24/2021 12:40 PM ?Performed by: Gwyneth Sprout, MD ?Authorized by: Gwyneth Sprout, MD  ?Consent: Verbal consent obtained. ?Body area: rectum ? ?Sedation: ?Patient sedated: no ? ?Patient restrained: no ?Patient cooperative: yes ?Localization method: speculum ?Removal mechanism: ring forceps ?Complexity: simple ?1 objects recovered. ?Objects recovered: suppository still in packaging ?Post-procedure assessment: foreign body removed ?Patient tolerance: patient tolerated the procedure well with no immediate complications ?Comments: Significant amount of bleeding was already present in the rectum most likely from manipulation prior to removal ?  ? ? ?Medications Ordered in ED ?Medications  ?LORazepam (ATIVAN) tablet 1 mg (1 mg Oral Given 08/24/21 1133)  ?lidocaine (XYLOCAINE) 2 % jelly 1 application. (1 application. Topical Given 08/24/21 1133)  ? ? ?ED Course/ Medical Decision Making/ A&P ?  ?                        ?Medical Decision  Making ?Risk ?Prescription drug management. ? ? ?Patient presenting today due to a foreign body in his rectum.  He accidentally inserted a suppository without taking it out of the packaging.  He has been trying for hours to get it out and has even tried using tweezers and has now started to cause rectal bleeding.  Pain in the rectum but no other complaints.  He deals with constipation chronically and this is related to medications.  He has various suppositories and stool softeners at home to use.  Low suspicion for an acute abdominal process.  Foreign object palpated on rectal exam.  Unable to remove with finger.  Used a speculum to visualize and it was removed with ring forceps.  Patient does have a significant amount of bleeding and multiple  mucosal tears most likely from his attempts to remove earlier today.  Low suspicion for perforation.  Patient feels much improved after the foreign body was removed.  Recommended sitz bath's, MiraLAX to have softer bowel movements and cautioned that he would have some bleeding over the next 24 to 48 hours but if it does not stop he should return especially if he develops severe pain ? ? ? ? ? ? ? ?Final Clinical Impression(s) / ED Diagnoses ?Final diagnoses:  ?Rectal foreign body, initial encounter  ?Rectal fissure  ? ? ?Rx / DC Orders ?ED Discharge Orders   ? ? None  ? ?  ? ? ?  ?Gwyneth SproutPlunkett, Raelynne Ludwick, MD ?08/24/21 1242 ? ?

## 2021-08-25 ENCOUNTER — Ambulatory Visit: Payer: Medicare HMO | Admitting: Gastroenterology

## 2021-08-28 NOTE — ED Notes (Signed)
Pt called about his cane being here, he states he will come and gett ?

## 2021-09-01 NOTE — ED Notes (Signed)
Pt contacted about his cane. Pt stated that he would come by at some point and try to get it. Pt instructed that Cane left at front desk. ?

## 2021-12-09 ENCOUNTER — Ambulatory Visit
Admission: RE | Admit: 2021-12-09 | Discharge: 2021-12-09 | Disposition: A | Discharge status: Home / Self Care | Payer: Medicare HMO | Source: Ambulatory Visit | Attending: Registered Nurse | Admitting: Registered Nurse

## 2021-12-09 ENCOUNTER — Other Ambulatory Visit: Payer: Self-pay | Admitting: Registered Nurse

## 2021-12-09 DIAGNOSIS — R059 Cough, unspecified: Secondary | ICD-10-CM

## 2021-12-09 DIAGNOSIS — R062 Wheezing: Secondary | ICD-10-CM

## 2021-12-22 NOTE — ED Notes (Signed)
VM left regarding his cane that was left behind after his last ED visit

## 2022-01-01 ENCOUNTER — Emergency Department (HOSPITAL_COMMUNITY)
Admission: EM | Admit: 2022-01-01 | Discharge: 2022-01-01 | Disposition: A | Discharge status: Home / Self Care | Payer: Medicare HMO | Attending: Student | Admitting: Student

## 2022-01-01 ENCOUNTER — Emergency Department (HOSPITAL_COMMUNITY): Payer: Medicare HMO

## 2022-01-01 ENCOUNTER — Other Ambulatory Visit: Payer: Self-pay

## 2022-01-01 ENCOUNTER — Encounter (HOSPITAL_COMMUNITY): Payer: Self-pay

## 2022-01-01 DIAGNOSIS — I10 Essential (primary) hypertension: Secondary | ICD-10-CM | POA: Insufficient documentation

## 2022-01-01 DIAGNOSIS — E119 Type 2 diabetes mellitus without complications: Secondary | ICD-10-CM | POA: Diagnosis not present

## 2022-01-01 DIAGNOSIS — Z79899 Other long term (current) drug therapy: Secondary | ICD-10-CM | POA: Diagnosis not present

## 2022-01-01 DIAGNOSIS — Z7901 Long term (current) use of anticoagulants: Secondary | ICD-10-CM | POA: Diagnosis not present

## 2022-01-01 DIAGNOSIS — R0602 Shortness of breath: Secondary | ICD-10-CM

## 2022-01-01 DIAGNOSIS — Z794 Long term (current) use of insulin: Secondary | ICD-10-CM | POA: Insufficient documentation

## 2022-01-01 DIAGNOSIS — J45909 Unspecified asthma, uncomplicated: Secondary | ICD-10-CM | POA: Diagnosis not present

## 2022-01-01 DIAGNOSIS — Z7952 Long term (current) use of systemic steroids: Secondary | ICD-10-CM | POA: Insufficient documentation

## 2022-01-01 LAB — CBC WITH DIFFERENTIAL/PLATELET
Abs Immature Granulocytes: 0.01 10*3/uL (ref 0.00–0.07)
Basophils Absolute: 0.1 10*3/uL (ref 0.0–0.1)
Basophils Relative: 1 %
Eosinophils Absolute: 0.2 10*3/uL (ref 0.0–0.5)
Eosinophils Relative: 4 %
HCT: 44.2 % (ref 39.0–52.0)
Hemoglobin: 15.1 g/dL (ref 13.0–17.0)
Immature Granulocytes: 0 %
Lymphocytes Relative: 28 %
Lymphs Abs: 1.5 10*3/uL (ref 0.7–4.0)
MCH: 30 pg (ref 26.0–34.0)
MCHC: 34.2 g/dL (ref 30.0–36.0)
MCV: 87.7 fL (ref 80.0–100.0)
Monocytes Absolute: 0.3 10*3/uL (ref 0.1–1.0)
Monocytes Relative: 5 %
Neutro Abs: 3.4 10*3/uL (ref 1.7–7.7)
Neutrophils Relative %: 62 %
Platelets: 163 10*3/uL (ref 150–400)
RBC: 5.04 MIL/uL (ref 4.22–5.81)
RDW: 13.6 % (ref 11.5–15.5)
WBC: 5.4 10*3/uL (ref 4.0–10.5)
nRBC: 0 % (ref 0.0–0.2)

## 2022-01-01 LAB — BASIC METABOLIC PANEL
Anion gap: 10 (ref 5–15)
BUN: 15 mg/dL (ref 6–20)
CO2: 22 mmol/L (ref 22–32)
Calcium: 9.9 mg/dL (ref 8.9–10.3)
Chloride: 106 mmol/L (ref 98–111)
Creatinine, Ser: 0.83 mg/dL (ref 0.61–1.24)
GFR, Estimated: >60 mL/min (ref >60)
Glucose, Bld: 234 mg/dL — ABNORMAL HIGH (ref 70–99)
Potassium: 3.8 mmol/L (ref 3.5–5.1)
Sodium: 138 mmol/L (ref 135–145)

## 2022-01-01 LAB — BRAIN NATRIURETIC PEPTIDE: B Natriuretic Peptide: 14.9 pg/mL (ref 0.0–100.0)

## 2022-01-01 LAB — TROPONIN I (HIGH SENSITIVITY)
Troponin I (High Sensitivity): 3 ng/L (ref <18)
Troponin I (High Sensitivity): 2 ng/L (ref <18)

## 2022-01-01 LAB — D-DIMER, QUANTITATIVE: D-Dimer, Quant: <0.27 ug/mL-FEU (ref 0.00–0.50)

## 2022-01-01 MED ORDER — METOCLOPRAMIDE HCL 5 MG/ML IJ SOLN
10.0000 mg | Freq: Once | INTRAMUSCULAR | Status: AC
  Administered 2022-01-01: 10 mg via INTRAVENOUS
  Filled 2022-01-01: qty 2

## 2022-01-01 MED ORDER — IPRATROPIUM-ALBUTEROL 0.5-2.5 (3) MG/3ML IN SOLN
3.0000 mL | Freq: Once | RESPIRATORY_TRACT | Status: AC
Start: 2022-01-01 — End: 2022-01-01
  Administered 2022-01-01: 3 mL via RESPIRATORY_TRACT
  Filled 2022-01-01: qty 3

## 2022-01-01 MED ORDER — BENZONATATE 100 MG PO CAPS: 100.0000 mg | ORAL_CAPSULE | Freq: Three times a day (TID) | ORAL | 0 refills | Status: AC

## 2022-01-01 MED ORDER — DIPHENHYDRAMINE HCL 50 MG/ML IJ SOLN
12.5000 mg | Freq: Once | INTRAMUSCULAR | Status: AC
  Administered 2022-01-01: 12.5 mg via INTRAVENOUS
  Filled 2022-01-01: qty 1

## 2022-01-01 MED ORDER — KETOROLAC TROMETHAMINE 15 MG/ML IJ SOLN
15.0000 mg | Freq: Once | INTRAMUSCULAR | Status: AC
Start: 2022-01-01 — End: 2022-01-01
  Administered 2022-01-01: 15 mg via INTRAVENOUS
  Filled 2022-01-01: qty 1

## 2022-01-01 MED ORDER — METHYLPREDNISOLONE SODIUM SUCC 125 MG IJ SOLR
125.0000 mg | Freq: Every day | INTRAMUSCULAR | Status: DC
Start: 2022-01-01 — End: 2022-01-01
  Administered 2022-01-01: 125 mg via INTRAVENOUS
  Filled 2022-01-01: qty 2

## 2022-01-01 MED ORDER — PREDNISONE 10 MG PO TABS: 20.0000 mg | ORAL_TABLET | Freq: Every day | ORAL | 0 refills | Status: AC

## 2022-01-01 NOTE — ED Provider Notes (Signed)
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 male. Patient presents the ED with a chief complaint of shortness of breath and chest tightness.  He states he started having worsening shortness of breath about 2 weeks ago.   He is seen by his primary care provider who put him on a steroid taper and gave him a nebulizer.  He says the nebulizer works a little bit but the symptoms quickly come back.  He states that he has chest tightness in the center of his chest that is worse with coughing and taking deep breaths.  It has been present all the time and seems to have acutely worsened over the past 24 hours.  Shortness of breath is worse with exertion with associated dizziness.  He also has associated dry, nonproductive cough. He does have a history of pulmonary embolism, and states that his symptoms feel kind of similar to that, however not quite as severe.  He reports that he has had no recent missed doses other than when he had to stop taking his Xarelto for dental procedure which he thinks was about 1 to 2 months ago.  He also reports having an episode of hypoxia today to 84% before he did his nebulizer treatment.  He thinks there may have been a malfunction with the device because when he repeated it it measured over 100%. Patient denies any new leg swelling, abdominal pain, nausea, vomiting, fevers, chills, or wheezing.  Patient denies any recent travel, recent surgery, or recent hospitalization. He reports history of asthma, but no flares or problems with this in several years. He does nto smoke.   Asthma Associated symptoms include chest pain and shortness of breath. Pertinent negatives include no abdominal pain.   Chest Pain Associated symptoms: cough, dizziness and shortness of breath   Associated symptoms: no abdominal pain, no fever, no nausea and no vomiting        Home Medications Prior to Admission medications   Medication Sig Start Date End Date Taking? Authorizing Provider  predniSONE (DELTASONE) 10 MG tablet Take 2 tablets (20 mg total) by mouth daily for 5 days. 01/01/22 01/06/22 Yes Ireland Virrueta, Finis Bud, PA-C  albuterol (VENTOLIN HFA) 108 (90 Base) MCG/ACT inhaler Inhale 2 puffs into the lungs every 6 (six) hours as needed for wheezing. 06/17/21   [provider]  atorvastatin (LIPITOR) 40 MG tablet Take 40 mg by mouth daily. 06/16/21   [provider]  azelastine (ASTELIN) 0.1 % nasal spray Place 1-2 sprays into both nostrils 2 (two) times daily as needed for allergies.    [provider]  cyclobenzaprine (FLEXERIL) 10 MG tablet Take 10 mg by mouth 3 (three) times daily. 07/11/21   [provider]  DULoxetine (CYMBALTA) 60 MG capsule Take 1 capsule (60 mg total) by mouth daily. 07/23/21 08/22/21  Lauro Franklin, MD  ferrous sulfate 325 (65 FE) MG tablet Take 1 tablet (325 mg total) by mouth at bedtime. 07/22/21   Lauro Franklin, MD  JARDIANCE 25 MG TABS tablet Take 25 mg by mouth daily. 07/12/21   [provider]  LANTUS SOLOSTAR 100 UNIT/ML Solostar Pen Inject 30 Units into the skin at bedtime. 07/12/21  [provider]  melatonin 3 MG TABS tablet Take 2 tablets (6 mg total) by mouth at bedtime. 07/22/21   Lauro Franklin, MD  NOVOLOG FLEXPEN 100 UNIT/ML FlexPen Inject 0-12 Units into the skin 3 (three) times daily. 180-200=2; 201-250=5; 251-300=7; 301-350=10; OVER 350=12 05/06/21   [provider]  pantoprazole (PROTONIX) 40 MG tablet Take 1 tablet (40 mg total) by mouth daily. 07/23/21 08/22/21  Lauro Franklin, MD  polyvinyl alcohol (LIQUIFILM TEARS) 1.4 % ophthalmic solution 1 drop as needed for dry eyes.     [provider]  pregabalin (LYRICA) 150 MG capsule Take 150 mg by mouth 2 (two) times daily. 06/16/21   [provider]  propranolol ER (INDERAL LA) 60 MG 24 hr capsule Take 60 mg by mouth daily. 05/20/21   [provider]  QUEtiapine (SEROQUEL) 100 MG tablet Take 1 tablet (100 mg total) by mouth at bedtime. 07/22/21 08/21/21  Lauro Franklin, MD  rivaroxaban (XARELTO) 20 MG TABS tablet Take 20 mg by mouth daily with supper. Pt reports "not taking as clotting isn't that bad" but per outpatient notes this is still prescribed. Last filled 90 day supply on 06/29/21    [provider]  TRULICITY 1.5 MG/0.5ML SOPN Inject 1.5 mg into the skin once a week. 05/21/21   [provider]  XTAMPZA ER 13.5 MG C12A Take 13.5 mg by mouth 2 (two) times daily. 06/22/21   [provider]      Allergies    Dextromethorphan-guaifenesin, Buspirone hcl, Ketamine, Ambien [zolpidem], and Morphine    Review of Systems   Review of Systems  Constitutional:  Negative for chills and fever.  Respiratory:  Positive for cough, chest tightness and shortness of breath. Negative for wheezing.   Cardiovascular:  Positive for chest pain.  Gastrointestinal:  Negative for abdominal pain, nausea and vomiting.  Neurological:  Positive for dizziness.  All other systems reviewed and are negative.   Physical Exam Updated Vital Signs BP 115/73   Pulse 100   Temp 97.9 F (36.6 C) (Oral)   Resp 19   SpO2 95%  Physical Exam Vitals and nursing note reviewed.  Constitutional:      General: He is not in acute distress.    Appearance: Normal appearance. He is well-developed. He is not ill-appearing, toxic-appearing or diaphoretic.  HENT:     Head: Normocephalic and atraumatic.     Nose: No nasal deformity.     Mouth/Throat:     Lips: Pink. No lesions.  Eyes:     General: Gaze aligned appropriately. No scleral icterus.       Right eye: No discharge.        Left eye: No  discharge.     Conjunctiva/sclera: Conjunctivae normal.     Right eye: Right conjunctiva is not injected. No exudate or hemorrhage.    Left eye: Left conjunctiva is not injected. No exudate or hemorrhage. Neck:     Vascular: No JVD.     Trachea: No tracheal deviation.  Cardiovascular:     Rate and Rhythm: Normal rate and regular rhythm.     Pulses:          Radial pulses are 2+ on the right side and 2+ on the left side.       Dorsalis pedis pulses are 2+ on the right side and 2+ on the left side.     Heart sounds: Normal heart sounds. Heart sounds not distant. No murmur heard.  No systolic murmur is present.     No diastolic murmur is present.     No friction rub. No gallop. No S3 or S4 sounds.  Pulmonary:     Effort: Pulmonary effort is normal. No tachypnea, accessory muscle usage or respiratory distress.     Breath sounds: No stridor. No decreased breath sounds, wheezing, rhonchi or rales.  Abdominal:     Palpations: Abdomen is soft.     Tenderness: There is no abdominal tenderness. There is no guarding or rebound.  Musculoskeletal:     Right lower leg: No tenderness. No edema.     Left lower leg: No tenderness. No edema.  Skin:    General: Skin is warm and dry.  Neurological:     Mental Status: He is alert and oriented to person, place, and time.  Psychiatric:        Mood and Affect: Mood normal.        Speech: Speech normal.        Behavior: Behavior normal. Behavior is cooperative.     ED Results / Procedures / Treatments   Labs (all labs ordered are listed, but only abnormal results are displayed) Labs Reviewed  BASIC METABOLIC PANEL - Abnormal; Notable for the following components:      Result Value   Glucose, Bld 234 (*)    All other components within normal limits  BRAIN NATRIURETIC PEPTIDE  CBC WITH DIFFERENTIAL/PLATELET  D-DIMER, QUANTITATIVE  TROPONIN I (HIGH SENSITIVITY)  TROPONIN I (HIGH SENSITIVITY)    EKG None  Radiology DG Chest 2  View  Result Date: 01/01/2022 CLINICAL DATA:  shortness of breath EXAM: CHEST - 2 VIEW COMPARISON:  Chest x-ray 12/09/2021. FINDINGS: The heart size and mediastinal contours are within normal limits. Both lungs are clear. No visible pleural effusions or pneumothorax. No acute osseous abnormality. IMPRESSION: No active cardiopulmonary disease. Electronically Signed   By: Feliberto Harts M.D.   On: 01/01/2022 11:13    Procedures Procedures  This patient was on telemetry or cardiac monitoring during their time in the ED.    Medications Ordered in ED Medications  methylPREDNISolone sodium succinate (SOLU-MEDROL) 125 mg/2 mL injection 125 mg (125 mg Intravenous Given 01/01/22 1220)  ipratropium-albuterol (DUONEB) 0.5-2.5 (3) MG/3ML nebulizer solution 3 mL (3 mLs Nebulization Given 01/01/22 1217)  ipratropium-albuterol (DUONEB) 0.5-2.5 (3) MG/3ML nebulizer solution 3 mL (3 mLs Nebulization Given 01/01/22 1230)  ipratropium-albuterol (DUONEB) 0.5-2.5 (3) MG/3ML nebulizer solution 3 mL (3 mLs Nebulization Given 01/01/22 1228)  ketorolac (TORADOL) 15 MG/ML injection 15 mg (15 mg Intravenous Given 01/01/22 1306)  metoCLOPramide (REGLAN) injection 10 mg (10 mg Intravenous Given 01/01/22 1305)  diphenhydrAMINE (BENADRYL) injection 12.5 mg (12.5 mg Intravenous Given 01/01/22 1308)    ED Course/ Medical Decision Making/ A&P Clinical Course as of 01/01/22 1415  Fri Jan 01, 2022  1214 My attending physician has seen and evaluated this patient. Recommends nebulizer x 3, Solumedrol. HA cocktail following this.  [GL]    Clinical Course User Index [GL] Victorino Dike Finis Bud, PA-C                           Medical Decision Making Amount and/or Complexity of Data Reviewed Labs: ordered. Radiology: ordered.  Risk Prescription drug management.    MDM  This is a 51 y.o. male who presents to the ED with worsening shortness of breath and chest tightness over the past two weeks. The differential of this patient includes  but is not limited to CHF, ACS, COPD, Asthma, PNA, PE, PTX, Anxiety, Viral PNA, and Bronchitis.   Initial Impression  Patient is overall well-appearing with stable vitals here. He does look mildly short of breath at rest.  His lungs seem diminished and seems tight. No wheezing though. Gave duo neb x 3 and solu medrol. He does have a history of pulmonary embolism and had that one questionable episode of hypoxia at home, as well as pleuritic type chest tightness, Wells score is 1.5. No recent missed doses of his home xaralto though.  Does not appear hypervolemic.  Labs ordered. Will reassess.  I personally ordered, reviewed, and interpreted all laboratory work and imaging and agree with radiologist interpretation. Results interpreted below: cbc, bmp normal. Troponin negative x 2 BnP negative D dimer negative CXR without acute abnormality EKG non ischemic  Assessment/Plan:  - patient endorses improvement in breathing after nebulizers.  He is slightly tachycardic now likely from albuterol. Mild expiratory wheezing more evident on lung exam, but overall improvement in work of breathing. - d dimer is negative and his presentation is more consistent with asthma/COPD flare. Do not think we need to get CTA chest at this time. - Patient also endorsing headache so will give him a HA cocktail. - Plan to reassess and ambulate prior to dispo.   @1422 , reassessment reveals improvement of breathing.  He has clear lung sounds.  Headache is somewhat improved after cocktail.  Ambulation trial success. Recommend Prednisone burst as well as continued albuterol nebs as needed at home. I discussed with patient outpatient follow-up.  He has no smoking history or problems with his asthma until now, so he may benefit from outpatient PFT studies or pulmonology referral if deemed necessary by PCP.   Charting Requirements Additional history is obtained from:  Independent historian External Records from outside source  obtained and reviewed including: recent appt in may 2023 for URI which preceded all current symptoms. Social Determinants of Health:  none Pertinant PMH that complicates patient's illness: hx of asthma  Patient Care Problems that were addressed during this visit: - shortness of breath: Acute illness with systemic symptoms This patient was maintained on a cardiac monitor/telemetry. I personally viewed and interpreted the cardiac monitor which reveals an underlying rhythm of NSR Medications given in ED: Duo neb x 3, solumedrol, HA cocktail Reevaluation of the patient after these medicines showed that the patient improved I have reviewed home medications and made changes accordingly.  Critical Care Interventions: n/a Consultations: n/a Disposition: discharge  This is a shared visit with my attending physician, Dr. June 2023.  We have discussed this patient and they have independently evaluated this patient. The plan was altered or changed as needed.  Portions of this note were generated with Audrie Lia. Dictation errors may occur despite best attempts at proofreading.      Final Clinical Impression(s) / ED Diagnoses Final diagnoses:  Shortness of breath    Rx / DC Orders ED Discharge Orders          Ordered    predniSONE (DELTASONE) 10 MG tablet  Daily        01/01/22 1408              Adolph Clutter, 03/03/22, PA-C 01/01/22 1415    [place], [place], MD 01/04/22 [phone]

## 2022-01-01 NOTE — ED Triage Notes (Signed)
Pt endorses chest tightness and SHOB that began last night Pt has hx of asthma. Pt reports using inhaler and neb treatment without relief.

## 2022-01-01 NOTE — Discharge Instructions (Signed)
Please get prescription filled for Prednisone.  Please call PCP and schedule follow up visit for consideration of pulmonary function tests, advanced imaging, or pulmonology referral.

## 2022-03-05 NOTE — Progress Notes (Signed)
Synopsis: Referred in October 2023 for asthma, has a history of pulmonary embolism  Subjective:   PATIENT ID: Tanner Mcgrath GENDER: male DOB: 10-24-1970, MRN: [id number]   HPI  Chief Complaint  Patient presents with   Consult    asthma    [name] says that years ago he was diagnosed with asthma, but it never gave him too much trouble until now.  Lately he says that he has been feeling like his "windpipe is closing up" and he feels tightness in his neck and chest and he labors to get air in and out.  He has a strong cough associated with it which is sometimes productive and other times it is dry.  He says that when it's dry it really drives him nuts, particularly at night.  He used to smoke cigarettes from age 33 to age 75, 1-1.5ppd.  No childhood was normal without respiratory illnesses.  Asthma was diagnosed in adulthood.  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  He was recently prescribed an albuterol inhaler and a steroid inhaler, he thinks flovent.  He says that thes are helping him, without them he'd be more dyspneic.  Typically his dyspnea is worse in the morning and he feels very short of breath so he uses a nebulizer.  After taking the neb he feels better.    He was just married and didn't have any difficult breathing, but when he came back home he noticed a lot more difficult breathing.  He says that his wife has a cat, and they were just married so he is around the cat a lot He has a lot of runny nose, needs benadryl.  He has flonase which he was just prescribed a few weeks ago.  He also has astelin, this helps a lot as well.    ACT score is 10  Record review: The patient was seen in the emergency department in September 2023 for chest tightness and wheezing.  At that time he had blood work which showed a negative D-dimer, his symptoms improved with albuterol.  Of note, he had a history of pulmonary embolism and had been taking Eliquis. Records  from [place] January 04, 2022 reviewed where the patient was seen in the setting of an asthma exacerbation.  He was referred to our clinic for further evaluation.  He was prescribed Wixela, albuterol, guaifenesin.   Past Medical History:  Diagnosis Date   Asthma    Bipolar affective disorder ([place])    Depression    Diabetes mellitus without complication ([place])    DVT (deep venous thrombosis) ([facility])    ED (erectile dysfunction)    GERD (gastroesophageal reflux disease)    Hyperlipidemia    Hypertension    Insomnia    Low testosterone    Lumbar stenosis    Obese    Obstructive sleep apnea    Pulmonary embolism ([facility])    Tremor    Vitamin D deficiency      History reviewed. No pertinent family history.   Social History   Socioeconomic History   Marital status: Divorced    Spouse name: Not on file   Number of children: Not on file   Years of education: Not on file   Highest education level: Not on file  Occupational History   Not on file  Tobacco Use   Smoking status: Never   Smokeless tobacco: Current  Substance and Sexual Activity   Alcohol use: Not on file  Drug use: Not Currently   Sexual activity: Yes  Other Topics Concern   Not on file  Social History Narrative   Not on file   Social Determinants of Health   Financial Resource Strain: Not on file  Food Insecurity: Not on file  Transportation Needs: Not on file  Physical Activity: Not on file  Stress: Not on file  Social Connections: Not on file  Intimate Partner Violence: Not on file     Allergies  Allergen Reactions   Dextromethorphan-Guaifenesin Anaphylaxis    Dextromethorphan specifically, causes "hard to breathe" and "Itching" Dextromethorphan specifically, causes "hard to breathe" and "Itching"    Buspirone Hcl Rash    rash   Ketamine     Other reaction(s): Other, Sweating (intolerance) Panic  Panic     Ambien [Zolpidem]    Morphine      Outpatient Medications Prior to Visit   Medication Sig Dispense Refill   albuterol (VENTOLIN HFA) 108 (90 Base) MCG/ACT inhaler Inhale 2 puffs into the lungs every 6 (six) hours as needed for wheezing.     ARIPiprazole (ABILIFY) 5 MG tablet Take by mouth.     atorvastatin (LIPITOR) 40 MG tablet Take 40 mg by mouth daily.     azelastine (ASTELIN) 0.1 % nasal spray Place 1-2 sprays into both nostrils 2 (two) times daily as needed for allergies.     cyclobenzaprine (FLEXERIL) 10 MG tablet Take 10 mg by mouth 3 (three) times daily.     ferrous sulfate 325 (65 FE) MG tablet Take 1 tablet (325 mg total) by mouth at bedtime.  3   fluticasone-salmeterol (ADVAIR) 100-50 MCG/ACT AEPB Inhale into the lungs.     JARDIANCE 25 MG TABS tablet Take 25 mg by mouth daily.     melatonin 3 MG TABS tablet Take 2 tablets (6 mg total) by mouth at bedtime.  0   NOVOLOG FLEXPEN 100 UNIT/ML FlexPen Inject 0-12 Units into the skin 3 (three) times daily. 180-200=2; 201-250=5; 251-300=7; 301-350=10; OVER 350=12     pregabalin (LYRICA) 150 MG capsule Take 150 mg by mouth 2 (two) times daily.     propranolol ER (INDERAL LA) 60 MG 24 hr capsule Take 60 mg by mouth daily.     rivaroxaban (XARELTO) 20 MG TABS tablet Take 20 mg by mouth daily with supper. Pt reports "not taking as clotting isn't that bad" but per outpatient notes this is still prescribed. Last filled 90 day supply on 3/89/37     TRULICITY 1.5 DS/2.8JG SOPN Inject 1.5 mg into the skin once a week.     DULoxetine (CYMBALTA) 60 MG capsule Take 1 capsule (60 mg total) by mouth daily. 30 capsule 0   LANTUS SOLOSTAR 100 UNIT/ML Solostar Pen Inject 30 Units into the skin at bedtime. (Patient not taking: Reported on 03/09/2022)     pantoprazole (PROTONIX) 40 MG tablet Take 1 tablet (40 mg total) by mouth daily. 30 tablet 0   polyvinyl alcohol (LIQUIFILM TEARS) 1.4 % ophthalmic solution 1 drop as needed for dry eyes. (Patient not taking: Reported on 03/09/2022)     QUEtiapine (SEROQUEL) 100 MG tablet Take 1  tablet (100 mg total) by mouth at bedtime. 30 tablet 0   XTAMPZA ER 13.5 MG C12A Take 13.5 mg by mouth 2 (two) times daily. (Patient not taking: Reported on 03/09/2022)     No facility-administered medications prior to visit.    Review of Systems  Constitutional:  Negative for chills, fever, malaise/fatigue and weight loss.  HENT:  Negative for congestion, nosebleeds, sinus pain and sore throat.   Eyes:  Negative for photophobia, pain and discharge.  Respiratory:  Positive for cough, sputum production and shortness of breath. Negative for hemoptysis and wheezing.   Cardiovascular:  Negative for chest pain, palpitations, orthopnea and leg swelling.  Gastrointestinal:  Negative for abdominal pain, constipation, diarrhea, nausea and vomiting.  Genitourinary:  Negative for dysuria, frequency, hematuria and urgency.  Musculoskeletal:  Negative for back pain, joint pain, myalgias and neck pain.  Skin:  Negative for itching and rash.  Neurological:  Negative for tingling, tremors, sensory change, speech change, focal weakness, seizures, weakness and headaches.  Psychiatric/Behavioral:  Negative for memory loss, substance abuse and suicidal ideas. The patient is not nervous/anxious.       Objective:  Physical Exam   Vitals:   03/09/22 1359  BP: 112/84  Pulse: 92  SpO2: 95%  Weight: 227 lb (103 kg)  Height: 6' (1.829 m)   RA   Gen: well appearing, no acute distress HENT: NCAT, OP clear, neck supple without masses Eyes: PERRL, EOMi Lymph: no cervical lymphadenopathy PULM: End exp wheezing bilaterally CV: RRR, no mgr, no JVD GI: BS+, soft, nontender, no hsm Derm: no rash or skin breakdown MSK: normal bulk and tone Neuro: A&Ox4, CN II-XII intact, strength 5/5 in all 4 extremities Psyche: normal mood and affect   CBC    Component Value Date/Time   WBC 5.4 01/01/2022 1026   RBC 5.04 01/01/2022 1026   HGB 15.1 01/01/2022 1026   HCT 44.2 01/01/2022 1026   PLT 163 01/01/2022  1026   MCV 87.7 01/01/2022 1026   MCH 30.0 01/01/2022 1026   MCHC 34.2 01/01/2022 1026   RDW 13.6 01/01/2022 1026   LYMPHSABS 1.5 01/01/2022 1026   MONOABS 0.3 01/01/2022 1026   EOSABS 0.2 01/01/2022 1026   BASOSABS 0.1 01/01/2022 1026     Chest imaging: 12/2021 CXR 2 view personally reviewed showing normal lung fields, normal cardiac silhouette  PFT:  Labs:  Path:  Echo:  Heart Catheterization:       Assessment & Plan:   Moderate persistent asthma without complication  Non-seasonal allergic rhinitis due to pollen  Former smoker  Discussion: Ran presents today with worsened control of his previously diagnosed asthma.  I do not doubt the prior diagnosis of asthma but I do wonder whether or not there is underlying concomitant COPD.  I worry that moving in to a house with a cat is making his symptoms worse.  Currently, we have ample opportunity to adjust his medications to control his symptoms.  Plan: Moderate persistent asthma: Full pulmonary function testing Exhaled nitric oxide testing Increased Advair 250 mg twice a day Continue albuterol as needed for chest tightness wheezing or shortness of breath Glad your flu shot is up-to-date CBC with differential Serum IgE  Allergic rhinitis: Refill Astelin Start taking Flonase which was recently prescribed I will prescribe zafirlukast instead of montelukast to avoid any sort of interaction with your underlying mental health problems  I will see you back in 3 to 4 weeks to go over the results of the lung function test and see how you are feeling after we have made the above listed changes to your medical regimen   Immunizations: Immunization History  Administered Date(s) Administered   Influenza Inj Mdck Quad Pf 03/07/2017   Influenza Inj Mdck Quad With Preservative 06/02/2018   Influenza Split 04/13/2011   Influenza,inj,Quad PF,6+ Mos 03/09/2016, 04/22/2020, 02/07/2022   MMR  08[birth year]14/1970   Measles  07/02/1987   PFIZER(Purple Top)SARS-COV-2 Vaccination 08/27/2019, 09/26/2019   Pneumococcal Polysaccharide-23 09/01/2014     Current Outpatient Medications:    albuterol (VENTOLIN HFA) 108 (90 Base) MCG/ACT inhaler, Inhale 2 puffs into the lungs every 6 (six) hours as needed for wheezing., Disp: , Rfl:    ARIPiprazole (ABILIFY) 5 MG tablet, Take by mouth., Disp: , Rfl:    atorvastatin (LIPITOR) 40 MG tablet, Take 40 mg by mouth daily., Disp: , Rfl:    azelastine (ASTELIN) 0.1 % nasal spray, Place 1-2 sprays into both nostrils 2 (two) times daily as needed for allergies., Disp: , Rfl:    cyclobenzaprine (FLEXERIL) 10 MG tablet, Take 10 mg by mouth 3 (three) times daily., Disp: , Rfl:    ferrous sulfate 325 (65 FE) MG tablet, Take 1 tablet (325 mg total) by mouth at bedtime., Disp: , Rfl: 3   fluticasone-salmeterol (ADVAIR) 100-50 MCG/ACT AEPB, Inhale into the lungs., Disp: , Rfl:    JARDIANCE 25 MG TABS tablet, Take 25 mg by mouth daily., Disp: , Rfl:    melatonin 3 MG TABS tablet, Take 2 tablets (6 mg total) by mouth at bedtime., Disp: , Rfl: 0   NOVOLOG FLEXPEN 100 UNIT/ML FlexPen, Inject 0-12 Units into the skin 3 (three) times daily. 180-200=2; 201-250=5; 251-300=7; 301-350=10; OVER 350=12, Disp: , Rfl:    pregabalin (LYRICA) 150 MG capsule, Take 150 mg by mouth 2 (two) times daily., Disp: , Rfl:    propranolol ER (INDERAL LA) 60 MG 24 hr capsule, Take 60 mg by mouth daily., Disp: , Rfl:    rivaroxaban (XARELTO) 20 MG TABS tablet, Take 20 mg by mouth daily with supper. Pt reports "not taking as clotting isn't that bad" but per outpatient notes this is still prescribed. Last filled 90 day supply on 06/29/21, Disp: , Rfl:    TRULICITY 1.5 RA/3.0NM SOPN, Inject 1.5 mg into the skin once a week., Disp: , Rfl:    zafirlukast (ACCOLATE) 10 MG tablet, Take 1 tablet (10 mg total) by mouth 2 (two) times daily., Disp: 60 tablet, Rfl: 2   DULoxetine (CYMBALTA) 60 MG capsule, Take 1 capsule (60 mg total)  by mouth daily., Disp: 30 capsule, Rfl: 0   LANTUS SOLOSTAR 100 UNIT/ML Solostar Pen, Inject 30 Units into the skin at bedtime. (Patient not taking: Reported on 03/09/2022), Disp: , Rfl:    pantoprazole (PROTONIX) 40 MG tablet, Take 1 tablet (40 mg total) by mouth daily., Disp: 30 tablet, Rfl: 0   polyvinyl alcohol (LIQUIFILM TEARS) 1.4 % ophthalmic solution, 1 drop as needed for dry eyes. (Patient not taking: Reported on 03/09/2022), Disp: , Rfl:    QUEtiapine (SEROQUEL) 100 MG tablet, Take 1 tablet (100 mg total) by mouth at bedtime., Disp: 30 tablet, Rfl: 0   XTAMPZA ER 13.5 MG C12A, Take 13.5 mg by mouth 2 (two) times daily. (Patient not taking: Reported on 03/09/2022), Disp: , Rfl:

## 2022-03-09 ENCOUNTER — Ambulatory Visit: Payer: Medicare HMO | Admitting: Pulmonary Disease

## 2022-03-09 ENCOUNTER — Encounter: Payer: Self-pay | Admitting: Pulmonary Disease

## 2022-03-09 VITALS — BP 112/84 | HR 92 | Ht 72.0 in | Wt 227.0 lb

## 2022-03-09 DIAGNOSIS — J301 Allergic rhinitis due to pollen: Secondary | ICD-10-CM | POA: Diagnosis not present

## 2022-03-09 DIAGNOSIS — J4540 Moderate persistent asthma, uncomplicated: Secondary | ICD-10-CM | POA: Diagnosis not present

## 2022-03-09 DIAGNOSIS — Z87891 Personal history of nicotine dependence: Secondary | ICD-10-CM | POA: Diagnosis not present

## 2022-03-09 LAB — CBC WITH DIFFERENTIAL/PLATELET
Basophils Absolute: 0.1 10*3/uL (ref 0.0–0.1)
Basophils Relative: 0.6 % (ref 0.0–3.0)
Eosinophils Absolute: 0.3 10*3/uL (ref 0.0–0.7)
Eosinophils Relative: 3.3 % (ref 0.0–5.0)
HCT: 42.6 % (ref 39.0–52.0)
Hemoglobin: 14.5 g/dL (ref 13.0–17.0)
Lymphocytes Relative: 26.6 % (ref 12.0–46.0)
Lymphs Abs: 2.2 10*3/uL (ref 0.7–4.0)
MCHC: 34.1 g/dL (ref 30.0–36.0)
MCV: 88.2 fl (ref 78.0–100.0)
Monocytes Absolute: 0.6 10*3/uL (ref 0.1–1.0)
Monocytes Relative: 7.6 % (ref 3.0–12.0)
Neutro Abs: 5 10*3/uL (ref 1.4–7.7)
Neutrophils Relative %: 61.9 % (ref 43.0–77.0)
Platelets: 191 10*3/uL (ref 150.0–400.0)
RBC: 4.83 Mil/uL (ref 4.22–5.81)
RDW: 13.3 % (ref 11.5–15.5)
WBC: 8.1 10*3/uL (ref 4.0–10.5)

## 2022-03-09 LAB — NITRIC OXIDE: FeNO level (ppb): 35

## 2022-03-09 MED ORDER — AZELASTINE HCL 0.1 % NA SOLN
1.0000 | Freq: Two times a day (BID) | NASAL | 3 refills | Status: AC | PRN
Start: 2022-03-09 — End: ?

## 2022-03-09 MED ORDER — ZAFIRLUKAST 10 MG PO TABS: 10.0000 mg | ORAL_TABLET | Freq: Two times a day (BID) | ORAL | 2 refills | Status: AC

## 2022-03-09 MED ORDER — FLUTICASONE-SALMETEROL 250-50 MCG/ACT IN AEPB: 1.0000 | INHALATION_SPRAY | Freq: Two times a day (BID) | RESPIRATORY_TRACT | 11 refills | Status: AC

## 2022-03-09 NOTE — Patient Instructions (Signed)
Moderate persistent asthma: Full pulmonary function testing Exhaled nitric oxide testing Increased Advair 250 mg twice a day Continue albuterol as needed for chest tightness wheezing or shortness of breath Glad your flu shot is up-to-date CBC with differential Serum IgE  Allergic rhinitis: Refill Astelin Start taking Flonase which was recently prescribed I will prescribe zafirlukast instead of montelukast to avoid any sort of interaction with your underlying mental health problems  I will see you back in 3 to 4 weeks to go over the results of the lung function test and see how you are feeling after we have made the above listed changes to your medical regimen

## 2022-03-10 LAB — IGE: IgE (Immunoglobulin E), Serum: 23 kU/L (ref <=114)

## 2022-03-18 ENCOUNTER — Other Ambulatory Visit: Payer: Self-pay | Admitting: Registered Nurse

## 2022-03-18 ENCOUNTER — Ambulatory Visit
Admission: RE | Admit: 2022-03-18 | Discharge: 2022-03-18 | Disposition: A | Discharge status: Home / Self Care | Payer: Medicare HMO | Source: Ambulatory Visit | Attending: Registered Nurse | Admitting: Registered Nurse

## 2022-03-18 DIAGNOSIS — M79642 Pain in left hand: Secondary | ICD-10-CM

## 2022-04-04 ENCOUNTER — Emergency Department (HOSPITAL_COMMUNITY)
Admission: EM | Admit: 2022-04-04 | Discharge: 2022-04-04 | Disposition: A | Discharge status: Home / Self Care | Payer: Medicare HMO | Attending: Emergency Medicine | Admitting: Emergency Medicine

## 2022-04-04 ENCOUNTER — Other Ambulatory Visit: Payer: Self-pay

## 2022-04-04 ENCOUNTER — Encounter (HOSPITAL_COMMUNITY): Payer: Self-pay | Admitting: Emergency Medicine

## 2022-04-04 ENCOUNTER — Emergency Department (HOSPITAL_COMMUNITY): Payer: Medicare HMO

## 2022-04-04 DIAGNOSIS — Z7901 Long term (current) use of anticoagulants: Secondary | ICD-10-CM | POA: Diagnosis not present

## 2022-04-04 DIAGNOSIS — Z7952 Long term (current) use of systemic steroids: Secondary | ICD-10-CM | POA: Insufficient documentation

## 2022-04-04 DIAGNOSIS — R0602 Shortness of breath: Secondary | ICD-10-CM | POA: Diagnosis present

## 2022-04-04 DIAGNOSIS — Z7951 Long term (current) use of inhaled steroids: Secondary | ICD-10-CM | POA: Insufficient documentation

## 2022-04-04 DIAGNOSIS — Z20822 Contact with and (suspected) exposure to covid-19: Secondary | ICD-10-CM | POA: Diagnosis not present

## 2022-04-04 DIAGNOSIS — J45901 Unspecified asthma with (acute) exacerbation: Secondary | ICD-10-CM | POA: Diagnosis not present

## 2022-04-04 LAB — BASIC METABOLIC PANEL
Anion gap: 8 (ref 5–15)
BUN: 10 mg/dL (ref 6–20)
CO2: 21 mmol/L — ABNORMAL LOW (ref 22–32)
Calcium: 9.1 mg/dL (ref 8.9–10.3)
Chloride: 108 mmol/L (ref 98–111)
Creatinine, Ser: 0.94 mg/dL (ref 0.61–1.24)
GFR, Estimated: >60 mL/min (ref >60)
Glucose, Bld: 183 mg/dL — ABNORMAL HIGH (ref 70–99)
Potassium: 4.1 mmol/L (ref 3.5–5.1)
Sodium: 137 mmol/L (ref 135–145)

## 2022-04-04 LAB — CBC WITH DIFFERENTIAL/PLATELET
Abs Immature Granulocytes: 0.02 10*3/uL (ref 0.00–0.07)
Basophils Absolute: 0.1 10*3/uL (ref 0.0–0.1)
Basophils Relative: 1 %
Eosinophils Absolute: 0.6 10*3/uL — ABNORMAL HIGH (ref 0.0–0.5)
Eosinophils Relative: 8 %
HCT: 43.1 % (ref 39.0–52.0)
Hemoglobin: 14.4 g/dL (ref 13.0–17.0)
Immature Granulocytes: 0 %
Lymphocytes Relative: 32 %
Lymphs Abs: 2.2 10*3/uL (ref 0.7–4.0)
MCH: 29.6 pg (ref 26.0–34.0)
MCHC: 33.4 g/dL (ref 30.0–36.0)
MCV: 88.7 fL (ref 80.0–100.0)
Monocytes Absolute: 0.4 10*3/uL (ref 0.1–1.0)
Monocytes Relative: 6 %
Neutro Abs: 3.6 10*3/uL (ref 1.7–7.7)
Neutrophils Relative %: 53 %
Platelets: 175 10*3/uL (ref 150–400)
RBC: 4.86 MIL/uL (ref 4.22–5.81)
RDW: 13.4 % (ref 11.5–15.5)
WBC: 6.8 10*3/uL (ref 4.0–10.5)
nRBC: 0 % (ref 0.0–0.2)

## 2022-04-04 LAB — RESP PANEL BY RT-PCR (FLU A&B, COVID) ARPGX2
Influenza A by PCR: NEGATIVE
Influenza B by PCR: NEGATIVE
SARS Coronavirus 2 by RT PCR: NEGATIVE

## 2022-04-04 LAB — TROPONIN I (HIGH SENSITIVITY): Troponin I (High Sensitivity): 2 ng/L (ref <18)

## 2022-04-04 MED ORDER — ACETAMINOPHEN 500 MG PO TABS
1000.0000 mg | ORAL_TABLET | Freq: Once | ORAL | Status: AC
  Administered 2022-04-04: 1000 mg via ORAL
  Filled 2022-04-04: qty 2

## 2022-04-04 MED ORDER — PREDNISONE 10 MG PO TABS: 40.0000 mg | ORAL_TABLET | Freq: Every day | ORAL | 0 refills | Status: AC

## 2022-04-04 MED ORDER — IPRATROPIUM-ALBUTEROL 0.5-2.5 (3) MG/3ML IN SOLN
3.0000 mL | Freq: Once | RESPIRATORY_TRACT | Status: AC
  Administered 2022-04-04: 3 mL via RESPIRATORY_TRACT
  Filled 2022-04-04: qty 3

## 2022-04-04 MED ORDER — METHYLPREDNISOLONE SODIUM SUCC 125 MG IJ SOLR
125.0000 mg | Freq: Once | INTRAMUSCULAR | Status: AC
Start: 2022-04-04 — End: 2022-04-04
  Administered 2022-04-04: 125 mg via INTRAVENOUS
  Filled 2022-04-04: qty 2

## 2022-04-04 NOTE — Discharge Instructions (Signed)
Return for any problem.  ?

## 2022-04-04 NOTE — ED Triage Notes (Signed)
Pt c/o increased SHOB since this AM, pt also c/o central chest pain and increased SHOB when sitting back. Pt states that he has used his inhaler and nebs without relief.

## 2022-04-04 NOTE — ED Provider Notes (Signed)
[facility]-EMERGENCY DEPT Provider Note   CSN: [id number] Arrival date & time: 04/04/22  1901     History  Chief Complaint  Patient presents with   Chest Pain   Shortness of Breath    Tanner Mcgrath is a 51 y.o. male.  51 year old male with prior medical history as detailed below presents for evaluation.  Patient with complaint of increased wheezing and difficulty breathing over the last 2 days.  Patient with use of home nebs without improvement.  Patient denies fever.  Patient reports associated cough.      The history is provided by the patient and medical records.       Home Medications Prior to Admission medications   Medication Sig Start Date End Date Taking? Authorizing Provider  predniSONE (DELTASONE) 10 MG tablet Take 4 tablets (40 mg total) by mouth daily for 5 days. 04/04/22 04/09/22 Yes Wynetta Fines, MD  traZODone (DESYREL) 150 MG tablet Take 150-300 mg by mouth at bedtime as needed. 02/08/22  Yes [provider]  albuterol (VENTOLIN HFA) 108 (90 Base) MCG/ACT inhaler Inhale 2 puffs into the lungs every 6 (six) hours as needed for wheezing. 06/17/21   [provider]  ARIPiprazole (ABILIFY) 5 MG tablet Take by mouth. 11/26/21   [provider]  atorvastatin (LIPITOR) 40 MG tablet Take 40 mg by mouth daily. 06/16/21   [provider]  azelastine (ASTELIN) 0.1 % nasal spray Place 1-2 sprays into both nostrils 2 (two) times daily as needed for allergies. 03/09/22   Lupita Leash, MD  cyclobenzaprine (FLEXERIL) 10 MG tablet Take 10 mg by mouth 3 (three) times daily. 07/11/21   [provider]  DULoxetine (CYMBALTA) 60 MG capsule Take 1 capsule (60 mg total) by mouth daily. 07/23/21 08/22/21  Lauro Franklin, MD  ferrous sulfate 325 (65 FE) MG tablet Take 1 tablet (325 mg total) by mouth at bedtime. 07/22/21   Lauro Franklin, MD  fluticasone-salmeterol (ADVAIR) 100-50 MCG/ACT AEPB Inhale into  the lungs. 11/30/21   [provider]  fluticasone-salmeterol (ADVAIR) 250-50 MCG/ACT AEPB Inhale 1 puff into the lungs every 12 (twelve) hours. 03/09/22   Lupita Leash, MD  JARDIANCE 25 MG TABS tablet Take 25 mg by mouth daily. 07/12/21   [provider]  LANTUS SOLOSTAR 100 UNIT/ML Solostar Pen Inject 30 Units into the skin at bedtime. Patient not taking: Reported on 03/09/2022 07/12/21   [provider]  melatonin 3 MG TABS tablet Take 2 tablets (6 mg total) by mouth at bedtime. 07/22/21   Lauro Franklin, MD  NOVOLOG FLEXPEN 100 UNIT/ML FlexPen Inject 0-12 Units into the skin 3 (three) times daily. 180-200=2; 201-250=5; 251-300=7; 301-350=10; OVER 350=12 05/06/21   [provider]  pantoprazole (PROTONIX) 40 MG tablet Take 1 tablet (40 mg total) by mouth daily. 07/23/21 08/22/21  Lauro Franklin, MD  polyvinyl alcohol (LIQUIFILM TEARS) 1.4 % ophthalmic solution 1 drop as needed for dry eyes. Patient not taking: Reported on 03/09/2022    [provider]  pregabalin (LYRICA) 150 MG capsule Take 150 mg by mouth 2 (two) times daily. 06/16/21   [provider]  propranolol ER (INDERAL LA) 60 MG 24 hr capsule Take 60 mg by mouth daily. 05/20/21   [provider]  QUEtiapine (SEROQUEL) 100 MG tablet Take 1 tablet (100 mg total) by mouth at bedtime. 07/22/21 08/21/21  Lauro Franklin, MD  rivaroxaban (XARELTO) 20 MG TABS tablet Take 20 mg  by mouth daily with supper. Pt reports "not taking as clotting isn't that bad" but per outpatient notes this is still prescribed. Last filled 90 day supply on 06/29/21    [provider]  TRULICITY 1.5 MG/0.5ML SOPN Inject 1.5 mg into the skin once a week. 05/21/21   [provider]  XTAMPZA ER 13.5 MG C12A Take 13.5 mg by mouth 2 (two) times daily. Patient not taking: Reported on 03/09/2022 06/22/21   [provider]  zafirlukast (ACCOLATE) 10 MG tablet Take 1 tablet  (10 mg total) by mouth 2 (two) times daily. 03/09/22   Lupita Leash, MD      Allergies    Dextromethorphan-guaifenesin, Buspirone hcl, Ketamine, Ambien [zolpidem], and Morphine    Review of Systems   Review of Systems  All other systems reviewed and are negative.   Physical Exam Updated Vital Signs BP 122/84   Pulse 73   Temp 97.8 F (36.6 C) (Oral)   Resp (!) 29   Ht 6' (1.829 m)   Wt 99.8 kg   SpO2 94%   BMI 29.84 kg/m  Physical Exam Vitals and nursing note reviewed.  Constitutional:      General: He is not in acute distress.    Appearance: Normal appearance. He is well-developed.  HENT:     Head: Normocephalic and atraumatic.  Eyes:     Conjunctiva/sclera: Conjunctivae normal.     Pupils: Pupils are equal, round, and reactive to light.  Cardiovascular:     Rate and Rhythm: Normal rate and regular rhythm.     Heart sounds: Normal heart sounds.  Pulmonary:     Effort: Pulmonary effort is normal. No respiratory distress.     Comments: Diffuse expiratory wheezes in all lung fields Abdominal:     General: There is no distension.     Palpations: Abdomen is soft.     Tenderness: There is no abdominal tenderness.  Musculoskeletal:        General: No deformity. Normal range of motion.     Cervical back: Normal range of motion and neck supple.  Skin:    General: Skin is warm and dry.  Neurological:     General: No focal deficit present.     Mental Status: He is alert and oriented to person, place, and time.     ED Results / Procedures / Treatments   Labs (all labs ordered are listed, but only abnormal results are displayed) Labs Reviewed  BASIC METABOLIC PANEL - Abnormal; Notable for the following components:      Result Value   CO2 21 (*)    Glucose, Bld 183 (*)    All other components within normal limits  CBC WITH DIFFERENTIAL/PLATELET - Abnormal; Notable for the following components:   Eosinophils Absolute 0.6 (*)    All other components within  normal limits  RESP PANEL BY RT-PCR (FLU A&B, COVID) ARPGX2  TROPONIN I (HIGH SENSITIVITY)    EKG EKG Interpretation  Date/Time:  Sunday April 04 2022 19:16:54 EST Ventricular Rate:  93 PR Interval:  149 QRS Duration: 106 QT Interval:  363 QTC Calculation: 452 R Axis:   81 Text Interpretation: Sinus rhythm No significant change since last tracing Confirmed by Linwood Dibbles [phone]) on 04/04/2022 7:42:17 PM  Radiology DG Chest Port 1 View  Result Date: 04/04/2022 CLINICAL DATA:  Cough. Increasing shortness of breath. Patient reports central chest pain. EXAM: PORTABLE CHEST 1 VIEW COMPARISON:  01/01/2022 FINDINGS: The cardiomediastinal contours are normal. The lungs  are clear. Pulmonary vasculature is normal. No consolidation, pleural effusion, or pneumothorax. No acute osseous abnormalities are seen. IMPRESSION: No acute chest findings. Electronically Signed   By: Keith Rake M.D.   On: 04/04/2022 20:40    Procedures Procedures    Medications Ordered in ED Medications  methylPREDNISolone sodium succinate (SOLU-MEDROL) 125 mg/2 mL injection 125 mg (125 mg Intravenous Given 04/04/22 2045)  ipratropium-albuterol (DUONEB) 0.5-2.5 (3) MG/3ML nebulizer solution 3 mL (3 mLs Nebulization Given 04/04/22 2045)  acetaminophen (TYLENOL) tablet 1,000 mg (1,000 mg Oral Given 04/04/22 2110)  ipratropium-albuterol (DUONEB) 0.5-2.5 (3) MG/3ML nebulizer solution 3 mL (3 mLs Nebulization Given 04/04/22 2110)    ED Course/ Medical Decision Making/ A&P                           Medical Decision Making Amount and/or Complexity of Data Reviewed Labs: ordered. Radiology: ordered.  Risk OTC drugs. Prescription drug management.    Medical Screen Complete  This patient presented to the ED with complaint of wheezing, shortness of breath.  This complaint involves an extensive number of treatment options. The initial differential diagnosis includes, but is not limited to, bronchospasm, pneumonia,  metabolic abnormality, etc.  This presentation is: Acute, Self-Limited, Previously Undiagnosed, Uncertain Prognosis, Complicated, Systemic Symptoms, and Threat to Life/Bodily Function  Patient with history of asthma.  Patient presents with increased wheezing and shortness of breath.  Presentation is consistent with acute asthma exacerbation.  Patient's COVID and flu testing are negative.  Other screening labs obtained are without significant abnormality.  After treatment in the ED patient feels significant improved.  He now desires DC home.  Patient does understand need for close outpatient follow-up.  Strict return precautions given understood.  Additional history obtained:  External records from outside sources obtained and reviewed including prior ED visits and prior Inpatient records.    Lab Tests:  I ordered and personally interpreted labs.  The pertinent results include: CBC, BMP, COVID, flu, troponin   Imaging Studies ordered:  I ordered imaging studies including chest x-ray   I independently visualized and interpreted obtained imaging which showed NAD I agree with the radiologist interpretation.   Cardiac Monitoring:  The patient was maintained on a cardiac monitor.  I personally viewed and interpreted the cardiac monitor which showed an underlying rhythm of: NSR   Medicines ordered:  I ordered medication including Solu-Medrol, DuoNeb treatment for bronchospasm Reevaluation of the patient after these medicines showed that the patient: improved   Problem List / ED Course:  Wheezing, bronchospasm   Reevaluation:  After the interventions noted above, I reevaluated the patient and found that they have: improved  Disposition:  After consideration of the diagnostic results and the patients response to treatment, I feel that the patent would benefit from close outpatient follow-up.          Final Clinical Impression(s) / ED Diagnoses Final diagnoses:   Mild asthma with exacerbation, unspecified whether persistent    Rx / DC Orders ED Discharge Orders          Ordered    predniSONE (DELTASONE) 10 MG tablet  Daily        04/04/22 2226              Valarie Merino, MD 04/04/22 2321

## 2022-04-27 ENCOUNTER — Encounter: Payer: Self-pay | Admitting: Pulmonary Disease

## 2022-04-27 ENCOUNTER — Ambulatory Visit (INDEPENDENT_AMBULATORY_CARE_PROVIDER_SITE_OTHER): Payer: Medicare HMO | Admitting: Pulmonary Disease

## 2022-04-27 VITALS — BP 118/84 | HR 105 | Ht 72.0 in | Wt 223.0 lb

## 2022-04-27 DIAGNOSIS — J301 Allergic rhinitis due to pollen: Secondary | ICD-10-CM | POA: Diagnosis not present

## 2022-04-27 DIAGNOSIS — J4540 Moderate persistent asthma, uncomplicated: Secondary | ICD-10-CM

## 2022-04-27 DIAGNOSIS — Z87891 Personal history of nicotine dependence: Secondary | ICD-10-CM | POA: Diagnosis not present

## 2022-04-27 LAB — PULMONARY FUNCTION TEST
DL/VA % pred: 112 %
DL/VA: 4.91 ml/min/mmHg/L
DLCO cor % pred: 104 %
DLCO cor: 32.09 ml/min/mmHg
DLCO unc % pred: 103 %
DLCO unc: 31.9 ml/min/mmHg
FEF 25-75 Post: 4.06 L/sec
FEF 25-75 Pre: 3.32 L/sec
FEF2575-%Change-Post: 22 %
FEF2575-%Pred-Post: 113 %
FEF2575-%Pred-Pre: 93 %
FEV1-%Change-Post: 4 %
FEV1-%Pred-Post: 93 %
FEV1-%Pred-Pre: 90 %
FEV1-Post: 3.87 L
FEV1-Pre: 3.72 L
FEV1FVC-%Change-Post: 3 %
FEV1FVC-%Pred-Pre: 102 %
FEV6-%Change-Post: 0 %
FEV6-%Pred-Post: 90 %
FEV6-%Pred-Pre: 90 %
FEV6-Post: 4.69 L
FEV6-Pre: 4.68 L
FEV6FVC-%Change-Post: 0 %
FEV6FVC-%Pred-Post: 103 %
FEV6FVC-%Pred-Pre: 103 %
FVC-%Change-Post: 0 %
FVC-%Pred-Post: 88 %
FVC-%Pred-Pre: 87 %
FVC-Post: 4.73 L
FVC-Pre: 4.69 L
Post FEV1/FVC ratio: 82 %
Post FEV6/FVC ratio: 100 %
Pre FEV1/FVC ratio: 79 %
Pre FEV6/FVC Ratio: 100 %
RV % pred: 119 %
RV: 2.61 L
TLC % pred: 99 %
TLC: 7.32 L

## 2022-04-27 NOTE — Progress Notes (Signed)
Synopsis: Referred in October 2023 for asthma, has a history of pulmonary embolism  Subjective:   PATIENT ID: Tanner Mcgrath GENDER: male DOB: 1970-09-30, MRN: [id number]   HPI  Chief Complaint  Patient presents with   Follow-up   Tanner Mcgrath has been doing significantly better since the last visit.  His breathing has improved.  He has not used albuterol in several weeks.  He says that the Advair is working quite well for him.  He still has some postnasal drip but it is fairly well-controlled on Flonase.   Past Medical History:  Diagnosis Date   Asthma    Bipolar affective disorder ([place])    Depression    Diabetes mellitus without complication ([place])    DVT (deep venous thrombosis) (HCC)    ED (erectile dysfunction)    GERD (gastroesophageal reflux disease)    Hyperlipidemia    Hypertension    Insomnia    Low testosterone    Lumbar stenosis    Obese    Obstructive sleep apnea    Pulmonary embolism (HCC)    Tremor    Vitamin D deficiency       Review of Systems  Constitutional:  Negative for chills, fever, malaise/fatigue and weight loss.  HENT:  Negative for congestion, sinus pain and sore throat.   Respiratory:  Negative for cough, sputum production and shortness of breath.   Cardiovascular:  Negative for chest pain and leg swelling.      Objective:  Physical Exam   Vitals:   04/27/22 1605  BP: 118/84  Pulse: (!) 105  SpO2: 96%  Weight: 223 lb (101.2 kg)  Height: 6' (1.829 m)   RA   Gen: well appearing HENT: OP clear, neck supple PULM: CTA B, normal effort  CV: RRR, no mgr GI: BS+, soft, nontender Derm: no cyanosis or rash Psyche: normal mood and affect   CBC    Component Value Date/Time   WBC 6.8 04/04/2022 2047   RBC 4.86 04/04/2022 2047   HGB 14.4 04/04/2022 2047   HCT 43.1 04/04/2022 2047   PLT 175 04/04/2022 2047   MCV 88.7 04/04/2022 2047   MCH 29.6 04/04/2022 2047   MCHC 33.4 04/04/2022 2047   RDW 13.4 04/04/2022 2047   LYMPHSABS  2.2 04/04/2022 2047   MONOABS 0.4 04/04/2022 2047   EOSABS 0.6 (H) 04/04/2022 2047   BASOSABS 0.1 04/04/2022 2047     Chest imaging: 12/2021 CXR 2 view personally reviewed showing normal lung fields, normal cardiac silhouette  PFT:  November 2023 full PFT ratio 82%, FEV1 3.87 L 93% predicted, total lung capacity 7.3 99% predicted, DLCO 31.90 103% predicted  Labs: 2023 serum eosinophil 600,000 cells per deciliter, serum IgE elevated at _0 kilounits/L  Path:  Echo:  Heart Catheterization:       Assessment & Plan:   Moderate persistent asthma without complication  Non-seasonal allergic rhinitis due to pollen  Former smoker  Discussion: 51 year old male with moderate persistent asthma.  Lung function testing today shows no evidence of underlying COPD despite his prior smoking history.  Plan: Moderate persistent asthma: Continue Advair 250 twice a day no matter how you are feeling Albuterol as needed for chest tightness wheezing or shortness of breath  Allergic rhinitis: Continue Flonase as you are doing every day Saline rinses as needed for heavy sinus congestion Over-the-counter cetirizine 10 mg daily is also helpful when you have more severe symptoms  Follow-up with me in 1 year or sooner if needed.  Immunizations: Immunization History  Administered Date(s) Administered   Influenza Inj Mdck Quad Pf 03/07/2017   Influenza Inj Mdck Quad With Preservative 06/02/2018   Influenza Split 04/13/2011   Influenza,inj,Quad PF,6+ Mos 03/09/2016, 04/22/2020, 02/07/2022   MMR 08/14/1970   Measles 07/02/1987   PFIZER(Purple Top)SARS-COV-2 Vaccination 08/27/2019, 09/26/2019   Pneumococcal Polysaccharide-23 09/01/2014     Current Outpatient Medications:    albuterol (VENTOLIN HFA) 108 (90 Base) MCG/ACT inhaler, Inhale 2 puffs into the lungs every 6 (six) hours as needed for wheezing., Disp: , Rfl:    ARIPiprazole (ABILIFY) 5 MG tablet, Take by mouth., Disp: , Rfl:     atorvastatin (LIPITOR) 40 MG tablet, Take 40 mg by mouth daily., Disp: , Rfl:    azelastine (ASTELIN) 0.1 % nasal spray, Place 1-2 sprays into both nostrils 2 (two) times daily as needed for allergies., Disp: 30 mL, Rfl: 3   cyclobenzaprine (FLEXERIL) 10 MG tablet, Take 10 mg by mouth 3 (three) times daily., Disp: , Rfl:    fluticasone-salmeterol (ADVAIR) 100-50 MCG/ACT AEPB, Inhale into the lungs., Disp: , Rfl:    fluticasone-salmeterol (ADVAIR) 250-50 MCG/ACT AEPB, Inhale 1 puff into the lungs every 12 (twelve) hours., Disp: 60 each, Rfl: 11   JARDIANCE 25 MG TABS tablet, Take 25 mg by mouth daily., Disp: , Rfl:    melatonin 3 MG TABS tablet, Take 2 tablets (6 mg total) by mouth at bedtime., Disp: , Rfl: 0   NOVOLOG FLEXPEN 100 UNIT/ML FlexPen, Inject 0-12 Units into the skin 3 (three) times daily. 180-200=2; 201-250=5; 251-300=7; 301-350=10; OVER 350=12, Disp: , Rfl:    pregabalin (LYRICA) 150 MG capsule, Take 150 mg by mouth 2 (two) times daily., Disp: , Rfl:    propranolol ER (INDERAL LA) 60 MG 24 hr capsule, Take 60 mg by mouth daily., Disp: , Rfl:    rivaroxaban (XARELTO) 20 MG TABS tablet, Take 20 mg by mouth daily with supper. Pt reports "not taking as clotting isn't that bad" but per outpatient notes this is still prescribed. Last filled 90 day supply on 06/29/21, Disp: , Rfl:    traZODone (DESYREL) 150 MG tablet, Take 150-300 mg by mouth at bedtime as needed., Disp: , Rfl:    TRULICITY 1.5 OI/7.8MV SOPN, Inject 1.5 mg into the skin once a week., Disp: , Rfl:    zafirlukast (ACCOLATE) 10 MG tablet, Take 1 tablet (10 mg total) by mouth 2 (two) times daily., Disp: 60 tablet, Rfl: 2   pantoprazole (PROTONIX) 40 MG tablet, Take 1 tablet (40 mg total) by mouth daily., Disp: 30 tablet, Rfl: 0   QUEtiapine (SEROQUEL) 100 MG tablet, Take 1 tablet (100 mg total) by mouth at bedtime., Disp: 30 tablet, Rfl: 0

## 2022-04-27 NOTE — Patient Instructions (Signed)
Moderate persistent asthma: Continue Advair 250 twice a day no matter how you are feeling Albuterol as needed for chest tightness wheezing or shortness of breath  Allergic rhinitis: Continue Flonase as you are doing every day Saline rinses as needed for heavy sinus congestion Over-the-counter cetirizine 10 mg daily is also helpful when you have more severe symptoms  Follow-up with me in 1 year or sooner if needed.

## 2022-04-27 NOTE — Progress Notes (Signed)
PFT done today. 

## 2022-05-03 ENCOUNTER — Encounter: Payer: Self-pay | Admitting: Gastroenterology

## 2022-05-11 ENCOUNTER — Telehealth: Payer: Self-pay

## 2022-05-11 NOTE — Telephone Encounter (Signed)
PA request received via CMM through [facility] for Zafirlukast   PA has been submitted and is awaiting determination.  Key: [id number]

## 2022-05-13 ENCOUNTER — Other Ambulatory Visit (HOSPITAL_COMMUNITY): Payer: Self-pay

## 2022-05-13 NOTE — Telephone Encounter (Signed)
PA for Zafirlukast has been APPROVED.

## 2022-06-15 ENCOUNTER — Encounter: Payer: Self-pay | Admitting: Gastroenterology

## 2022-06-15 ENCOUNTER — Ambulatory Visit: Payer: Medicare HMO | Admitting: Gastroenterology

## 2022-06-15 ENCOUNTER — Telehealth: Payer: Self-pay | Admitting: *Deleted

## 2022-06-15 VITALS — BP 126/70 | HR 120 | Ht 72.0 in | Wt 221.8 lb

## 2022-06-15 DIAGNOSIS — Z1211 Encounter for screening for malignant neoplasm of colon: Secondary | ICD-10-CM | POA: Diagnosis not present

## 2022-06-15 DIAGNOSIS — Z7901 Long term (current) use of anticoagulants: Secondary | ICD-10-CM | POA: Diagnosis not present

## 2022-06-15 DIAGNOSIS — K219 Gastro-esophageal reflux disease without esophagitis: Secondary | ICD-10-CM

## 2022-06-15 MED ORDER — NA SULFATE-K SULFATE-MG SULF 17.5-3.13-1.6 GM/177ML PO SOLN: 1.0000 | Freq: Once | ORAL | 0 refills | Status: AC

## 2022-06-15 NOTE — Progress Notes (Signed)
06/15/2022 Tanner Mcgrath [id number] 1970-12-15   HISTORY OF PRESENT ILLNESS: This is a 52 year old male who is new to our office.  He was referred here by Arthur Holms, NP, for screening colonoscopy/colorectal cancer screening.  He has never had a colonoscopy in the past.  He says that his bowel habits alternate between constipation and diarrhea/loose stools.  He says it has always been like that.  He has only ever seen small amounts of bright red blood if he has had a really hard/constipated stool.  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  This is prescribed by his PCP.  He has held it for other dental procedures, etc. in the past without any issues.  He also reports longstanding issues with acid reflux since he was in his 51s.  He takes omeprazole 40 mg daily overall with good control.  He says that usually for a couple days after taking his Trulicity dosing that he will have a flare of his reflux symptoms that he is learned to add Pepcid for a couple of days and that seems to help.  Does have a family history of colon cancer in a paternal uncle.   Past Medical History:  Diagnosis Date   Asthma    Bipolar affective disorder ([place])    Depression    Diabetes mellitus without complication ([place])    DVT (deep venous thrombosis) (H[facility])    ED (erectile dysfunction)    GERD (gastroesophageal reflux disease)    Hyperlipidemia    Hypertension    Insomnia    Low testosterone    Lumbar stenosis    Obese    Obstructive sleep apnea    Pulmonary embolism (HCC)    Tremor    Vitamin D deficiency    Past Surgical History:  Procedure Laterality Date   CHOLECYSTECTOMY     NASAL SINUS SURGERY      reports that he has never smoked. He uses smokeless tobacco. He reports that he does not currently use alcohol. He reports that he does not currently use drugs. family history is not on file. Allergies  Allergen Reactions   Dextromethorphan-Guaifenesin Anaphylaxis     Dextromethorphan specifically, causes "hard to breathe" and "Itching" Dextromethorphan specifically, causes "hard to breathe" and "Itching"    Buspirone Hcl Rash    rash   Ketamine     Other reaction(s): Other, Sweating (intolerance) Panic  Panic     Ambien [Zolpidem]    Morphine       Outpatient Encounter Medications as of 06/15/2022  Medication Sig   albuterol (VENTOLIN HFA) 108 (90 Base) MCG/ACT inhaler Inhale 2 puffs into the lungs every 6 (six) hours as needed for wheezing.   ARIPiprazole (ABILIFY) 5 MG tablet Take by mouth.   atorvastatin (LIPITOR) 40 MG tablet Take 40 mg by mouth daily.   azelastine (ASTELIN) 0.1 % nasal spray Place 1-2 sprays into both nostrils 2 (two) times daily as needed for allergies.   cyclobenzaprine (FLEXERIL) 10 MG tablet Take 10 mg by mouth 3 (three) times daily.   fluticasone-salmeterol (ADVAIR) 100-50 MCG/ACT AEPB Inhale into the lungs.   fluticasone-salmeterol (ADVAIR) 250-50 MCG/ACT AEPB Inhale 1 puff into the lungs every 12 (twelve) hours.   JARDIANCE 25 MG TABS tablet Take 25 mg by mouth daily.   melatonin 3 MG TABS tablet Take 2 tablets (6 mg total) by mouth at bedtime.   Na Sulfate-K Sulfate-Mg Sulf 17.5-3.13-1.6 GM/177ML SOLN Take 1 kit by mouth  once for 1 dose.   NOVOLOG FLEXPEN 100 UNIT/ML FlexPen Inject 0-12 Units into the skin 3 (three) times daily. 180-200=2; 201-250=5; 251-300=7; 301-350=10; OVER 350=12   omeprazole (PRILOSEC) 40 MG capsule Take 40 mg by mouth daily.   pregabalin (LYRICA) 150 MG capsule Take 150 mg by mouth 2 (two) times daily.   propranolol ER (INDERAL LA) 60 MG 24 hr capsule Take 60 mg by mouth daily.   rivaroxaban (XARELTO) 20 MG TABS tablet Take 20 mg by mouth daily with supper. Pt reports "not taking as clotting isn't that bad" but per outpatient notes this is still prescribed. Last filled 90 day supply on 06/29/21   traZODone (DESYREL) 150 MG tablet Take 150-300 mg by mouth at bedtime as needed.   TRULICITY 1.5  BX/0.3YB SOPN Inject 1.5 mg into the skin once a week.   zafirlukast (ACCOLATE) 10 MG tablet Take 1 tablet (10 mg total) by mouth 2 (two) times daily.   QUEtiapine (SEROQUEL) 100 MG tablet Take 1 tablet (100 mg total) by mouth at bedtime.   [DISCONTINUED] pantoprazole (PROTONIX) 40 MG tablet Take 1 tablet (40 mg total) by mouth daily.   No facility-administered encounter medications on file as of 06/15/2022.    REVIEW OF SYSTEMS  : All other systems reviewed and negative except where noted in the History of Present Illness.   PHYSICAL EXAM: BP 126/70   Pulse (!) 120   Ht 6' (1.829 m)   Wt 221 lb 12.8 oz (100.6 kg)   BMI 30.08 kg/m  General: Well developed white male in no acute distress Head: Normocephalic and atraumatic Eyes:  Sclerae anicteric, conjunctiva pink. Ears: Normal auditory acuity Lungs: Clear throughout to auscultation; no W/R/R. Heart: Regular rate and rhythm; no M/R/G. Abdomen: Soft, non-distended.  BS present.  Non-tender. Rectal:  Will be done at the time of colonoscopy. Musculoskeletal: Symmetrical with no gross deformities  Skin: No lesions on visible extremities Extremities: No edema  Neurological: Alert oriented x 4, grossly non-focal Psychological:  Alert and cooperative. Normal mood and affect  ASSESSMENT AND PLAN: *CRC screening:  Never had colonoscopy in the past.  Will schedule with Dr. Rush Landmark.  We talked about trying Benefiber 2 teaspoons mixed in 8 ounces of liquid daily for his alternating bowel habits. *Chronic GERD: Longstanding GERD since his 55s.  Overall well-controlled on omeprazole 40 mg daily, but does get flare symptoms for a couple of days following his Trulicity dosing for which he adds in some Pepcid.  Will plan for a baseline EGD with Dr. Rush Landmark as well. *Chronic anticoagulation with Xarelto for history of PEs and DVTs:  Will hold Xarelto for 24 hours prior to endoscopic procedures - will instruct when and how to resume after  procedure. Benefits and risks of procedure explained including risks of bleeding, perforation, infection, missed lesions, reactions to medications and possible need for hospitalization and surgery for complications. Additional rare but real risk of stroke or other vascular clotting events off of Xarelto also explained and need to seek urgent help if any signs of these problems occur. Will communicate by phone or EMR with patient's prescribing provider to confirm that holding Xarelto is reasonable in this case.    CC:  Arthur Holms, NP

## 2022-06-15 NOTE — Patient Instructions (Signed)
You have been scheduled for an endoscopy and colonoscopy. Please follow the written instructions given to you at your visit today. Please pick up your prep supplies at the pharmacy within the next 1-3 days. If you use inhalers (even only as needed), please bring them with you on the day of your procedure.  _______________________________________________________  If your blood pressure at your visit was 140/90 or greater, please contact your primary care physician to follow up on this.  _______________________________________________________  If you are age 18 or older, your body mass index should be between 23-30. Your Body mass index is 30.08 kg/m. If this is out of the aforementioned range listed, please consider follow up with your Primary Care Provider.  If you are age 85 or younger, your body mass index should be between 19-25. Your Body mass index is 30.08 kg/m. If this is out of the aformentioned range listed, please consider follow up with your Primary Care Provider.   ________________________________________________________  The [facility] providers would like to encourage you to use [facility] to communicate with providers for non-urgent requests or questions.  Due to long hold times on the telephone, sending your provider a message by [facility] may be a faster and more efficient way to get a response.  Please allow 48 business hours for a response.  Please remember that this is for non-urgent requests.  _______________________________________________________

## 2022-06-15 NOTE — Progress Notes (Signed)
Attending Physician's Attestation   I have reviewed the chart.   I agree with the Advanced Practitioner's note, impression, and recommendations with any updates as below. Patient is an adequate candidate for Barrett's esophagus screening in the setting of his longstanding GERD and risk factor.  Colon cancer screening at same time will be pursued.   Justice Britain, MD [facility] Advanced [facility]oscopy Office # [phone]

## 2022-07-15 NOTE — Telephone Encounter (Signed)
Faxed request to Arthur Holms, NP to hold Xarelto.

## 2022-07-19 NOTE — Telephone Encounter (Signed)
Per Arthur Holms, NP patient may hold Xarelto 2 days prior to procedure and restart as soon as possible afterwards. Clearance sent to be scanned into chart.

## 2022-07-28 NOTE — Telephone Encounter (Signed)
Left message for patient to call office.  

## 2022-08-04 ENCOUNTER — Telehealth: Payer: Self-pay | Admitting: *Deleted

## 2022-08-04 NOTE — Telephone Encounter (Signed)
Patient informed to hold Xarelto. Patient voiced understanding. Patient requested for instructions to be sent via Mychart again.

## 2022-08-04 NOTE — Telephone Encounter (Signed)
Sent instruction per patients request via [place].

## 2022-08-16 ENCOUNTER — Telehealth: Payer: Self-pay | Admitting: Gastroenterology

## 2022-08-16 NOTE — Telephone Encounter (Signed)
Sorry he is not feeling well and hope he has improvement soon. I'll hopefully see him in the coming months (place 80-month recall into system if he hasn't already scheduled). GM

## 2022-08-16 NOTE — Telephone Encounter (Signed)
Inbound call from patient stating that he is scheduled to have a colonoscopy and endoscopy tomorrow  3/19 at 2:00 with Dr. Rush Landmark. Patient is requesting a call back from the nurse to discuss if he needs to reschedule due to feeling like he has bronchitis. Please advise.

## 2022-08-16 NOTE — Telephone Encounter (Signed)
Pt states that he has been feeling bad for the past week.  He has had congestion and has been  "coughing uncontrollably".  He is planning to be seen by his primary or go to the ED today because he does not feel any better and feels that he is wheezing.  He was hoping to be feeling better by now but would like to cancel his procedure for tomorrow since he has not improved.  He will call back to reschedule when he feels better.  Encouraged pt to be seen by someone today due to wheezing.  He verbalized understanding.

## 2022-08-17 ENCOUNTER — Encounter: Payer: Medicare HMO | Admitting: Gastroenterology

## 2022-08-17 NOTE — Telephone Encounter (Signed)
Dr. Rush Landmark, I am prepping this pt's chart for PV.  Is it to use the same Xarelto clearance from 06-15-22- hold for 2 days?    Thank you, Cyril Mourning

## 2022-08-17 NOTE — Telephone Encounter (Signed)
I think that should be fine. GM

## 2022-08-25 ENCOUNTER — Ambulatory Visit: Payer: Medicare HMO | Admitting: Nurse Practitioner

## 2022-08-31 ENCOUNTER — Ambulatory Visit (AMBULATORY_SURGERY_CENTER): Payer: Medicare HMO

## 2022-08-31 VITALS — Ht 72.0 in | Wt 230.0 lb

## 2022-08-31 DIAGNOSIS — Z1211 Encounter for screening for malignant neoplasm of colon: Secondary | ICD-10-CM

## 2022-08-31 DIAGNOSIS — K219 Gastro-esophageal reflux disease without esophagitis: Secondary | ICD-10-CM

## 2022-08-31 NOTE — Progress Notes (Signed)
No egg or soy allergy known to patient  No issues known to pt with past sedation with any surgeries or procedures Patient denies ever being told they had issues or difficulty with intubation  No FH of Malignant Hyperthermia Pt is not on diet pills Pt is not on  home 02  Pt is not on blood thinners  Pt with occ constipation. BM every other day when regular but then has periods where he will go weeks with a BM. Hard stools with straining. Pt to use miralax, increase water intake, and activity.  No A fib or A flutter Have any cardiac testing pending--no  Pt instructed to use Single[employer].com or GoodRx for a price reduction on prep   Patient's chart reviewed by Osvaldo Angst CNRA prior to previsit and patient appropriate for the [place].  Previsit completed and red dot placed by patient's name on their procedure day (on provider's schedule).

## 2022-09-15 ENCOUNTER — Ambulatory Visit
Admission: RE | Admit: 2022-09-15 | Discharge: 2022-09-15 | Disposition: A | Discharge status: Home / Self Care | Payer: Medicare HMO | Source: Ambulatory Visit | Attending: Registered Nurse | Admitting: Registered Nurse

## 2022-09-15 ENCOUNTER — Other Ambulatory Visit: Payer: Self-pay | Admitting: Registered Nurse

## 2022-09-15 ENCOUNTER — Encounter: Payer: Self-pay | Admitting: Gastroenterology

## 2022-09-15 DIAGNOSIS — M5442 Lumbago with sciatica, left side: Secondary | ICD-10-CM

## 2022-09-15 DIAGNOSIS — M25552 Pain in left hip: Secondary | ICD-10-CM

## 2022-09-29 ENCOUNTER — Encounter: Payer: Self-pay | Admitting: Gastroenterology

## 2022-09-29 ENCOUNTER — Ambulatory Visit (AMBULATORY_SURGERY_CENTER): Payer: Medicare HMO | Admitting: Gastroenterology

## 2022-09-29 VITALS — BP 134/96 | HR 92 | Temp 96.8°F | Resp 12 | Ht 72.0 in | Wt 230.0 lb

## 2022-09-29 DIAGNOSIS — K2950 Unspecified chronic gastritis without bleeding: Secondary | ICD-10-CM | POA: Diagnosis not present

## 2022-09-29 DIAGNOSIS — D123 Benign neoplasm of transverse colon: Secondary | ICD-10-CM | POA: Diagnosis not present

## 2022-09-29 DIAGNOSIS — K219 Gastro-esophageal reflux disease without esophagitis: Secondary | ICD-10-CM

## 2022-09-29 DIAGNOSIS — K2970 Gastritis, unspecified, without bleeding: Secondary | ICD-10-CM

## 2022-09-29 DIAGNOSIS — B3781 Candidal esophagitis: Secondary | ICD-10-CM | POA: Diagnosis not present

## 2022-09-29 DIAGNOSIS — Z1211 Encounter for screening for malignant neoplasm of colon: Secondary | ICD-10-CM | POA: Diagnosis not present

## 2022-09-29 MED ORDER — FLUCONAZOLE 100 MG PO TABS: 100.0000 mg | ORAL_TABLET | Freq: Every day | ORAL | 0 refills | Status: AC

## 2022-09-29 MED ORDER — SODIUM CHLORIDE 0.9 % IV SOLN: 500.0000 mL | Freq: Once | INTRAVENOUS | Status: DC

## 2022-09-29 NOTE — Progress Notes (Unsigned)
Called to room to assist during endoscopic procedure.  Patient ID and intended procedure confirmed with present staff. Received instructions for my participation in the procedure from the performing physician.  

## 2022-09-29 NOTE — Patient Instructions (Signed)
Follow a high fiber diet. Resume previous medications. Awaiting pathology results.  Handouts given on High fiber diet, Colon polyps and Hemorrhoids Treatment for Candidiasis will be initiated while pathology returns. Will begin Fluconazole 200 mg on day one and 100 mg on days 2-14. Continue present medications. May restart Xarelto in 48 hours 5/3 to decrease post interventional bleeding risk. Repeat Colonoscopy in 5-10 years for surveillance based on pathology results.   YOU HAD AN ENDOSCOPIC PROCEDURE TODAY AT THE [facility]:   Refer to the procedure report that was given to you for any specific questions about what was found during the examination.  If the procedure report does not answer your questions, please call your gastroenterologist to clarify.  If you requested that your care partner not be given the details of your procedure findings, then the procedure report has been included in a sealed envelope for you to review at your convenience later.  YOU SHOULD EXPECT: Some feelings of bloating in the abdomen. Passage of more gas than usual.  Walking can help get rid of the air that was put into your GI tract during the procedure and reduce the bloating. If you had a lower endoscopy (such as a colonoscopy or flexible sigmoidoscopy) you may notice spotting of blood in your stool or on the toilet paper. If you underwent a bowel prep for your procedure, you may not have a normal bowel movement for a few days.  Please Note:  You might notice some irritation and congestion in your nose or some drainage.  This is from the oxygen used during your procedure.  There is no need for concern and it should clear up in a day or so.  SYMPTOMS TO REPORT IMMEDIATELY:  Following lower endoscopy (colonoscopy or flexible sigmoidoscopy):  Excessive amounts of blood in the stool  Significant tenderness or worsening of abdominal pains  Swelling of the abdomen that is new, acute  Fever of 100F or  higher  Following upper endoscopy (EGD)  Vomiting of blood or coffee ground material  New chest pain or pain under the shoulder blades  Painful or persistently difficult swallowing  New shortness of breath  Fever of 100F or higher  Black, tarry-looking stools  For urgent or emergent issues, a gastroenterologist can be reached at any hour by calling [phone]. Do not use MyChart messaging for urgent concerns.    DIET:  We do recommend a small meal at first, but then you may proceed to your regular diet.  Drink plenty of fluids but you should avoid alcoholic beverages for 24 hours.  ACTIVITY:  You should plan to take it easy for the rest of today and you should NOT DRIVE or use heavy machinery until tomorrow (because of the sedation medicines used during the test).    FOLLOW UP: Our staff will call the number listed on your records the next business day following your procedure.  We will call around 7:15- 8:00 am to check on you and address any questions or concerns that you may have regarding the information given to you following your procedure. If we do not reach you, we will leave a message.     If any biopsies were taken you will be contacted by phone or by letter within the next 1-3 weeks.  Please call us at [phone] if you have not heard about the biopsies in 3 weeks.    SIGNATURES/CONFIDENTIALITY: You and/or your care partner have signed paperwork which will be entered into  your electronic medical record.  These signatures attest to the fact that that the information above on your After Visit Summary has been reviewed and is understood.  Full responsibility of the confidentiality of this discharge information lies with you and/or your care-partner.

## 2022-09-29 NOTE — Op Note (Signed)
[facility] Patient Name: Tanner Mcgrath Procedure Date: 09/29/2022 2:54 PM MRN: [id number] Endoscopist: Corliss Parish , MD, [id number] Age: 52 Referring MD:  Date of Birth: 1970-10-05 Gender: Male Account #: [id number] Procedure:                Upper GI endoscopy Indications:              Heartburn, Screening for Barrett's esophagus in                            patient at risk for this condition Medicines:                Monitored Anesthesia Care Procedure:                Pre-Anesthesia Assessment:                           - Prior to the procedure, a History and Physical                            was performed, and patient medications and                            allergies were reviewed. The patient's tolerance of                            previous anesthesia was also reviewed. The risks                            and benefits of the procedure and the sedation                            options and risks were discussed with the patient.                            All questions were answered, and informed consent                            was obtained. Prior Anticoagulants: The patient has                            taken Xarelto (rivaroxaban), last dose was 2 days                            prior to procedure. ASA Grade Assessment: II - A                            patient with mild systemic disease. After reviewing                            the risks and benefits, the patient was deemed in                            satisfactory condition to undergo the procedure.  After obtaining informed consent, the endoscope was                            passed under direct vision. Throughout the                            procedure, the patient's blood pressure, pulse, and                            oxygen saturations were monitored continuously. The                            Olympus Scope [id number] was introduced through the                             mouth, and advanced to the second part of duodenum.                            The upper GI endoscopy was accomplished without                            difficulty. The patient tolerated the procedure. Scope In: Scope Out: Findings:                 No gross lesions were noted in the proximal                            esophagus.                           Patchy, white plaques were found in the mid                            esophagus and in the distal esophagus. Biopsies                            were taken with a cold forceps for histology to                            rule out Candida.                           The Z-line was regular and was found 42 cm from the                            incisors.                           A 1 cm hiatal hernia was present.                           Patchy mildly erythematous mucosa without bleeding                            was found in the entire examined stomach.  Biopsies                            were taken with a cold forceps for histology and                            Helicobacter pylori testing.                           No gross lesions were noted in the duodenal bulb,                            in the first portion of the duodenum and in the                            second portion of the duodenum. Complications:            No immediate complications. Estimated Blood Loss:     Estimated blood loss was minimal. Impression:               - No gross lesions in the proximal esophagus.                            Esophageal plaques were found, suspicious for                            candidiasis found in the middle and distal                            esophagus - biopsied to rule out Candida.                           - Z-line regular, 42 cm from the incisors.                           - 1 cm hiatal hernia.                           - Erythematous mucosa in the stomach. Biopsied.                           - No gross lesions in the  duodenal bulb, in the                            first portion of the duodenum and in the second                            portion of the duodenum. Recommendation:           - Proceed to scheduled colonoscopy.                           - Await pathology results.                           - Treatment for likely  candidiasis will be                            initiated while pathology returns. Will begin                            fluconazole 200 mg on day 1 and 100 mg on days 2-14.                           - Continue present medications.                           - The findings and recommendations were discussed                            with the patient.                           - The findings and recommendations were discussed                            with the patient's family. Corliss Parish, MD 09/29/2022 3:39:00 PM

## 2022-09-29 NOTE — Progress Notes (Unsigned)
Uneventful anesthetic. Report to pacu rn. Vss. Care resumed by rn. 

## 2022-09-29 NOTE — Op Note (Signed)
[facility] Patient Name: Tanner Mcgrath Procedure Date: 09/29/2022 2:48 PM MRN: [id number] Endoscopist: Corliss Parish , MD, [id number] Age: 52 Referring MD:  Date of Birth: Nov 28, 1970 Gender: Male Account #: [id number] Procedure:                Colonoscopy Indications:              Screening for colorectal malignant neoplasm Medicines:                Monitored Anesthesia Care Procedure:                Pre-Anesthesia Assessment:                           - Prior to the procedure, a History and Physical                            was performed, and patient medications and                            allergies were reviewed. The patient's tolerance of                            previous anesthesia was also reviewed. The risks                            and benefits of the procedure and the sedation                            options and risks were discussed with the patient.                            All questions were answered, and informed consent                            was obtained. Prior Anticoagulants: The patient has                            taken Xarelto (rivaroxaban), last dose was 2 days                            prior to procedure. ASA Grade Assessment: II - A                            patient with mild systemic disease. After reviewing                            the risks and benefits, the patient was deemed in                            satisfactory condition to undergo the procedure.                           After obtaining informed consent, the colonoscope  was passed under direct vision. Throughout the                            procedure, the patient's blood pressure, pulse, and                            oxygen saturations were monitored continuously. The                            CF [id number] #[id number] was introduced through the anus                            and advanced to the 3 cm into the ileum. The                             colonoscopy was performed without difficulty. The                            patient tolerated the procedure. The quality of the                            bowel preparation was adequate. The terminal ileum,                            ileocecal valve, appendiceal orifice, and rectum                            were photographed. Scope In: 3:14:48 PM Scope Out: 3:29:40 PM Scope Withdrawal Time: 0 hours 11 minutes 10 seconds  Total Procedure Duration: 0 hours 14 minutes 52 seconds  Findings:                 The digital rectal exam findings include                            hemorrhoids. Pertinent negatives include no                            palpable rectal lesions.                           The colon (entire examined portion) revealed                            moderately excessive looping.                           The terminal ileum and ileocecal valve appeared                            normal.                           Two sessile polyps were found in the splenic  flexure and transverse colon. The polyps were 2 to                            4 mm in size. These polyps were removed with a cold                            snare. Resection and retrieval were complete.                           Normal mucosa was found in the entire colon.                           Non-bleeding non-thrombosed internal hemorrhoids                            were found during retroflexion, during perianal                            exam and during digital exam. The hemorrhoids were                            Grade II (internal hemorrhoids that prolapse but                            reduce spontaneously). Complications:            No immediate complications. Estimated Blood Loss:     Estimated blood loss was minimal. Impression:               - Hemorrhoids found on digital rectal exam.                           - There was significant looping of the colon.                            - The examined portion of the ileum was normal.                           - Two 2 to 4 mm polyps at the splenic flexure and                            in the transverse colon, removed with a cold snare.                            Resected and retrieved.                           - Normal mucosa in the entire examined colon.                           - Non-bleeding non-thrombosed internal hemorrhoids. Recommendation:           - The patient will be observed post-procedure,  until all discharge criteria are met.                           - Discharge patient to home.                           - Patient has a contact number available for                            emergencies. The signs and symptoms of potential                            delayed complications were discussed with the                            patient. Return to normal activities tomorrow.                            Written discharge instructions were provided to the                            patient.                           - High fiber diet.                           - Use FiberCon 1-2 tablets PO daily.                           - May restart Xarelto in 48 hours (5/3) to decrease                            post interventional bleeding risk.                           - Continue present medications.                           - Await pathology results.                           - Repeat colonoscopy in 5-10 years for surveillance                            based on pathology results.                           - The findings and recommendations were discussed                            with the patient.                           - The findings and recommendations were discussed  with the patient's family. Corliss Parish, MD 09/29/2022 3:42:27 PM

## 2022-09-29 NOTE — Progress Notes (Unsigned)
Pt's states no medical or surgical changes since previsit or office visit. 

## 2022-09-29 NOTE — Progress Notes (Unsigned)
GASTROENTEROLOGY PROCEDURE H&P NOTE   Primary Care Physician: Loura Back, NP  HPI: Tanner Mcgrath is a 52 y.o. male who presents for EGD/Colonoscopy for Barrett's screening and colon cancer screening.  Past Medical History:  Diagnosis Date   Anxiety    Arthritis    Asthma    Bipolar affective disorder (HCC)    Clotting disorder (HCC)    DVT and PE on xarelto   Depression    Diabetes mellitus without complication (HCC)    DVT (deep venous thrombosis) (HCC)    ED (erectile dysfunction)    GERD (gastroesophageal reflux disease)    Hyperlipidemia    Hypertension    Insomnia    Low testosterone    Lumbar stenosis    Obese    Obstructive sleep apnea    Pulmonary embolism (HCC)    Tremor    Vitamin D deficiency    Past Surgical History:  Procedure Laterality Date   CHOLECYSTECTOMY     NASAL SINUS SURGERY     Current Outpatient Medications  Medication Sig Dispense Refill   albuterol (VENTOLIN HFA) 108 (90 Base) MCG/ACT inhaler Inhale 2 puffs into the lungs every 6 (six) hours as needed for wheezing.     ARIPiprazole (ABILIFY) 5 MG tablet Take by mouth.     atorvastatin (LIPITOR) 40 MG tablet Take 40 mg by mouth daily.     azelastine (ASTELIN) 0.1 % nasal spray Place 1-2 sprays into both nostrils 2 (two) times daily as needed for allergies. 30 mL 3   cyclobenzaprine (FLEXERIL) 10 MG tablet Take 10 mg by mouth 3 (three) times daily.     fluticasone (FLONASE) 50 MCG/ACT nasal spray Place 2 sprays into both nostrils daily.     fluticasone-salmeterol (ADVAIR) 100-50 MCG/ACT AEPB Inhale into the lungs.     fluticasone-salmeterol (ADVAIR) 250-50 MCG/ACT AEPB Inhale 1 puff into the lungs every 12 (twelve) hours. 60 each 11   hydrOXYzine (VISTARIL) 50 MG capsule Take 50 mg by mouth 4 (four) times daily as needed for anxiety or itching. (Patient not taking: Reported on 08/31/2022)     JARDIANCE 25 MG TABS tablet Take 25 mg by mouth daily.     melatonin 3 MG TABS tablet Take 2  tablets (6 mg total) by mouth at bedtime.  0   Na Sulfate-K Sulfate-Mg Sulf 17.5-3.13-1.6 GM/177ML SOLN Take by mouth. (Patient not taking: Reported on 08/31/2022)     NOVOLOG FLEXPEN 100 UNIT/ML FlexPen Inject 0-12 Units into the skin 3 (three) times daily. 180-200=2; 201-250=5; 251-300=7; 301-350=10; OVER 350=12     omeprazole (PRILOSEC) 40 MG capsule Take 40 mg by mouth daily.     pregabalin (LYRICA) 150 MG capsule Take 150 mg by mouth 2 (two) times daily.     propranolol ER (INDERAL LA) 80 MG 24 hr capsule Take 80 mg by mouth daily.     QUEtiapine Fumarate 150 MG TABS Take 150 mg by mouth at bedtime.     rivaroxaban (XARELTO) 20 MG TABS tablet Take 20 mg by mouth daily with supper. Pt reports "not taking as clotting isn't that bad" but per outpatient notes this is still prescribed. Last filled 90 day supply on 06/29/21     traZODone (DESYREL) 150 MG tablet Take 150-300 mg by mouth at bedtime as needed.     TRELEGY ELLIPTA 100-62.5-25 MCG/ACT AEPB Inhale 1 puff into the lungs daily.     TRUE METRIX BLOOD GLUCOSE TEST test strip      TRULICITY  1.5 MG/0.5ML SOPN Inject 1.5 mg into the skin once a week.     venlafaxine XR (EFFEXOR-XR) 150 MG 24 hr capsule Take 150 mg by mouth daily.     zafirlukast (ACCOLATE) 10 MG tablet Take 1 tablet (10 mg total) by mouth 2 (two) times daily. 60 tablet 2   No current facility-administered medications for this visit.    Current Outpatient Medications:    albuterol (VENTOLIN HFA) 108 (90 Base) MCG/ACT inhaler, Inhale 2 puffs into the lungs every 6 (six) hours as needed for wheezing., Disp: , Rfl:    ARIPiprazole (ABILIFY) 5 MG tablet, Take by mouth., Disp: , Rfl:    atorvastatin (LIPITOR) 40 MG tablet, Take 40 mg by mouth daily., Disp: , Rfl:    azelastine (ASTELIN) 0.1 % nasal spray, Place 1-2 sprays into both nostrils 2 (two) times daily as needed for allergies., Disp: 30 mL, Rfl: 3   cyclobenzaprine (FLEXERIL) 10 MG tablet, Take 10 mg by mouth 3 (three) times  daily., Disp: , Rfl:    fluticasone (FLONASE) 50 MCG/ACT nasal spray, Place 2 sprays into both nostrils daily., Disp: , Rfl:    fluticasone-salmeterol (ADVAIR) 100-50 MCG/ACT AEPB, Inhale into the lungs., Disp: , Rfl:    fluticasone-salmeterol (ADVAIR) 250-50 MCG/ACT AEPB, Inhale 1 puff into the lungs every 12 (twelve) hours., Disp: 60 each, Rfl: 11   hydrOXYzine (VISTARIL) 50 MG capsule, Take 50 mg by mouth 4 (four) times daily as needed for anxiety or itching. (Patient not taking: Reported on 08/31/2022), Disp: , Rfl:    JARDIANCE 25 MG TABS tablet, Take 25 mg by mouth daily., Disp: , Rfl:    melatonin 3 MG TABS tablet, Take 2 tablets (6 mg total) by mouth at bedtime., Disp: , Rfl: 0   Na Sulfate-K Sulfate-Mg Sulf 17.5-3.13-1.6 GM/177ML SOLN, Take by mouth. (Patient not taking: Reported on 08/31/2022), Disp: , Rfl:    NOVOLOG FLEXPEN 100 UNIT/ML FlexPen, Inject 0-12 Units into the skin 3 (three) times daily. 180-200=2; 201-250=5; 251-300=7; 301-350=10; OVER 350=12, Disp: , Rfl:    omeprazole (PRILOSEC) 40 MG capsule, Take 40 mg by mouth daily., Disp: , Rfl:    pregabalin (LYRICA) 150 MG capsule, Take 150 mg by mouth 2 (two) times daily., Disp: , Rfl:    propranolol ER (INDERAL LA) 80 MG 24 hr capsule, Take 80 mg by mouth daily., Disp: , Rfl:    QUEtiapine Fumarate 150 MG TABS, Take 150 mg by mouth at bedtime., Disp: , Rfl:    rivaroxaban (XARELTO) 20 MG TABS tablet, Take 20 mg by mouth daily with supper. Pt reports "not taking as clotting isn't that bad" but per outpatient notes this is still prescribed. Last filled 90 day supply on 06/29/21, Disp: , Rfl:    traZODone (DESYREL) 150 MG tablet, Take 150-300 mg by mouth at bedtime as needed., Disp: , Rfl:    TRELEGY ELLIPTA 100-62.5-25 MCG/ACT AEPB, Inhale 1 puff into the lungs daily., Disp: , Rfl:    TRUE METRIX BLOOD GLUCOSE TEST test strip, , Disp: , Rfl:    TRULICITY 1.5 MG/0.5ML SOPN, Inject 1.5 mg into the skin once a week., Disp: , Rfl:     venlafaxine XR (EFFEXOR-XR) 150 MG 24 hr capsule, Take 150 mg by mouth daily., Disp: , Rfl:    zafirlukast (ACCOLATE) 10 MG tablet, Take 1 tablet (10 mg total) by mouth 2 (two) times daily., Disp: 60 tablet, Rfl: 2 Allergies  Allergen Reactions   Dextromethorphan-Guaifenesin Anaphylaxis    Dextromethorphan  specifically, causes "hard to breathe" and "Itching" Dextromethorphan specifically, causes "hard to breathe" and "Itching"    Buspirone Hcl Rash    rash   Ketamine     Other reaction(s): Other, Sweating (intolerance) Panic  Panic     Ambien [Zolpidem]    Morphine    Family History  Problem Relation Age of Onset   Colon cancer Maternal Uncle    Colon polyps Neg Hx    Esophageal cancer Neg Hx    Rectal cancer Neg Hx    Stomach cancer Neg Hx    Social History   Socioeconomic History   Marital status: Divorced    Spouse name: Not on file   Number of children: Not on file   Years of education: Not on file   Highest education level: Not on file  Occupational History   Not on file  Tobacco Use   Smoking status: Never   Smokeless tobacco: Former    Quit date: 2005  Vaping Use   Vaping Use: Never used  Substance and Sexual Activity   Alcohol use: Not Currently   Drug use: Not Currently   Sexual activity: Yes  Other Topics Concern   Not on file  Social History Narrative   Not on file   Social Determinants of Health   Financial Resource Strain: Not on file  Food Insecurity: Not on file  Transportation Needs: Not on file  Physical Activity: Not on file  Stress: Not on file  Social Connections: Not on file  Intimate Partner Violence: Not on file    Physical Exam: There were no vitals filed for this visit. There is no height or weight on file to calculate BMI. GEN: NAD EYE: Sclerae anicteric ENT: MMM CV: Non-tachycardic GI: Soft, NT/ND NEURO:  Alert & Oriented x 3  Lab Results: No results for input(s): "WBC", "HGB", "HCT", "PLT" in the last 72  hours. BMET No results for input(s): "NA", "K", "CL", "CO2", "GLUCOSE", "BUN", "CREATININE", "CALCIUM" in the last 72 hours. LFT No results for input(s): "PROT", "ALBUMIN", "AST", "ALT", "ALKPHOS", "BILITOT", "BILIDIR", "IBILI" in the last 72 hours. PT/INR No results for input(s): "LABPROT", "INR" in the last 72 hours.   Impression / Plan: This is a 52 y.o.male  who presents for EGD/Colonoscopy for Barrett's screening and colon cancer screening.  The risks and benefits of endoscopic evaluation/treatment were discussed with the patient and/or family; these include but are not limited to the risk of perforation, infection, bleeding, missed lesions, lack of diagnosis, severe illness requiring hospitalization, as well as anesthesia and sedation related illnesses.  The patient's history has been reviewed, patient examined, no change in status, and deemed stable for procedure.  The patient and/or family is agreeable to proceed.    Corliss Parish, MD [facility]oscopy Office # [phone]

## 2022-09-30 ENCOUNTER — Telehealth: Payer: Self-pay

## 2022-09-30 NOTE — Telephone Encounter (Signed)
Post procedure follow up call, no answer 

## 2022-10-08 ENCOUNTER — Encounter: Payer: Self-pay | Admitting: Gastroenterology

## 2022-12-19 ENCOUNTER — Other Ambulatory Visit: Payer: Self-pay

## 2022-12-19 ENCOUNTER — Emergency Department (HOSPITAL_COMMUNITY)
Admission: EM | Admit: 2022-12-19 | Discharge: 2022-12-19 | Disposition: A | Discharge status: Home / Self Care | Payer: Medicare HMO | Attending: Emergency Medicine | Admitting: Emergency Medicine

## 2022-12-19 ENCOUNTER — Emergency Department (HOSPITAL_COMMUNITY): Payer: Medicare HMO

## 2022-12-19 ENCOUNTER — Encounter (HOSPITAL_COMMUNITY): Payer: Self-pay

## 2022-12-19 DIAGNOSIS — Z7901 Long term (current) use of anticoagulants: Secondary | ICD-10-CM | POA: Insufficient documentation

## 2022-12-19 DIAGNOSIS — Z794 Long term (current) use of insulin: Secondary | ICD-10-CM | POA: Diagnosis not present

## 2022-12-19 DIAGNOSIS — R0602 Shortness of breath: Secondary | ICD-10-CM | POA: Insufficient documentation

## 2022-12-19 DIAGNOSIS — J45909 Unspecified asthma, uncomplicated: Secondary | ICD-10-CM | POA: Diagnosis not present

## 2022-12-19 DIAGNOSIS — Z1152 Encounter for screening for COVID-19: Secondary | ICD-10-CM | POA: Diagnosis not present

## 2022-12-19 DIAGNOSIS — R059 Cough, unspecified: Secondary | ICD-10-CM | POA: Diagnosis not present

## 2022-12-19 DIAGNOSIS — I10 Essential (primary) hypertension: Secondary | ICD-10-CM | POA: Insufficient documentation

## 2022-12-19 LAB — CBC
HCT: 46.8 % (ref 39.0–52.0)
Hemoglobin: 16.4 g/dL (ref 13.0–17.0)
MCH: 30.7 pg (ref 26.0–34.0)
MCHC: 35 g/dL (ref 30.0–36.0)
MCV: 87.5 fL (ref 80.0–100.0)
Platelets: 197 10*3/uL (ref 150–400)
RBC: 5.35 MIL/uL (ref 4.22–5.81)
RDW: 13.2 % (ref 11.5–15.5)
WBC: 8 10*3/uL (ref 4.0–10.5)
nRBC: 0 % (ref 0.0–0.2)

## 2022-12-19 LAB — I-STAT VENOUS BLOOD GAS, ED
Acid-Base Excess: 0 mmol/L (ref 0.0–2.0)
Bicarbonate: 24 mmol/L (ref 20.0–28.0)
Calcium, Ion: 1.1 mmol/L — ABNORMAL LOW (ref 1.15–1.40)
HCT: 45 % (ref 39.0–52.0)
Hemoglobin: 15.3 g/dL (ref 13.0–17.0)
O2 Saturation: 99 %
Potassium: 3.6 mmol/L (ref 3.5–5.1)
Sodium: 140 mmol/L (ref 135–145)
TCO2: 25 mmol/L (ref 22–32)
pCO2, Ven: 35.2 mmHg — ABNORMAL LOW (ref 44–60)
pH, Ven: 7.442 — ABNORMAL HIGH (ref 7.25–7.43)
pO2, Ven: 140 mmHg — ABNORMAL HIGH (ref 32–45)

## 2022-12-19 LAB — RESP PANEL BY RT-PCR (RSV, FLU A&B, COVID)  RVPGX2
Influenza A by PCR: NEGATIVE
Influenza B by PCR: NEGATIVE
Resp Syncytial Virus by PCR: NEGATIVE
SARS Coronavirus 2 by RT PCR: NEGATIVE

## 2022-12-19 LAB — BASIC METABOLIC PANEL
Anion gap: 13 (ref 5–15)
BUN: 10 mg/dL (ref 6–20)
CO2: 20 mmol/L — ABNORMAL LOW (ref 22–32)
Calcium: 9.1 mg/dL (ref 8.9–10.3)
Chloride: 103 mmol/L (ref 98–111)
Creatinine, Ser: 0.87 mg/dL (ref 0.61–1.24)
GFR, Estimated: >60 mL/min (ref >60)
Glucose, Bld: 191 mg/dL — ABNORMAL HIGH (ref 70–99)
Potassium: 3.4 mmol/L — ABNORMAL LOW (ref 3.5–5.1)
Sodium: 136 mmol/L (ref 135–145)

## 2022-12-19 LAB — BRAIN NATRIURETIC PEPTIDE: B Natriuretic Peptide: 8.4 pg/mL (ref 0.0–100.0)

## 2022-12-19 LAB — TROPONIN I (HIGH SENSITIVITY)
Troponin I (High Sensitivity): 3 ng/L (ref <18)
Troponin I (High Sensitivity): 4 ng/L (ref <18)

## 2022-12-19 MED ORDER — PREDNISONE 50 MG PO TABS: ORAL_TABLET | ORAL | 0 refills | Status: AC

## 2022-12-19 MED ORDER — DOXYCYCLINE HYCLATE 100 MG PO TABS
100.0000 mg | ORAL_TABLET | Freq: Once | ORAL | Status: AC
  Administered 2022-12-19: 100 mg via ORAL
  Filled 2022-12-19: qty 1

## 2022-12-19 MED ORDER — FENTANYL CITRATE PF 50 MCG/ML IJ SOSY
50.0000 ug | PREFILLED_SYRINGE | Freq: Once | INTRAMUSCULAR | Status: AC
  Administered 2022-12-19: 50 ug via INTRAVENOUS
  Filled 2022-12-19: qty 1

## 2022-12-19 MED ORDER — IPRATROPIUM-ALBUTEROL 0.5-2.5 (3) MG/3ML IN SOLN
3.0000 mL | Freq: Once | RESPIRATORY_TRACT | Status: AC
  Administered 2022-12-19: 3 mL via RESPIRATORY_TRACT
  Filled 2022-12-19: qty 3

## 2022-12-19 MED ORDER — DOXYCYCLINE HYCLATE 100 MG PO CAPS: 100.0000 mg | ORAL_CAPSULE | Freq: Two times a day (BID) | ORAL | 0 refills | Status: DC

## 2022-12-19 MED ORDER — IOHEXOL 350 MG/ML SOLN
75.0000 mL | Freq: Once | INTRAVENOUS | Status: AC | PRN
  Administered 2022-12-19: 75 mL via INTRAVENOUS

## 2022-12-19 MED ORDER — HYDROCODONE-ACETAMINOPHEN 5-325 MG PO TABS
2.0000 | ORAL_TABLET | Freq: Once | ORAL | Status: AC
  Administered 2022-12-19: 2 via ORAL
  Filled 2022-12-19: qty 2

## 2022-12-19 MED ORDER — METHYLPREDNISOLONE SODIUM SUCC 125 MG IJ SOLR
125.0000 mg | Freq: Once | INTRAMUSCULAR | Status: AC
  Administered 2022-12-19: 125 mg via INTRAVENOUS
  Filled 2022-12-19: qty 2

## 2022-12-19 NOTE — ED Triage Notes (Signed)
Pt c/o non radiating left sided chest pain and [id number]. Pt c/o productive cough. Pt not sure the color of mucous.

## 2022-12-19 NOTE — ED Provider Notes (Signed)
Patient's care assumed at 3:30 PM from [facility], [place].  Patient has a past medical history of asthma and previous pulmonary embolus and DVT.  Patient presents today with shortness of breath.  Patient is pending CT angio of his chest.  Patient is receiving DuoNebs and IV Solu-Medrol.  Patient CT angio returned and shows diffuse bronchial airway thickening consistent with bronchitis. Patient reevaluated patient's O2 sats are currently 97%.  Patient's lungs no wheezing no rhonchi.  Patient reports he feels better.  Counseled on results he has taken prednisone in the past I will put the patient on 50 mg of prednisone for the next 5 days he is given a prescription for doxycycline.  He is to continue nebulizations at home and current medications patient is advised to see his primary care doctor for recheck in 2 to 3 days he should return to the emergency department if symptoms worsen or change.   Elson Areas, [place] 12/19/22 1730    Terald Sleeper, MD 12/19/22 Jerene Bears

## 2022-12-19 NOTE — ED Provider Notes (Signed)
[place] EMERGENCY DEPARTMENT AT [facility] Provider Note   CSN: [id number] Arrival date & time: 12/19/22  1336     History  Chief Complaint  Patient presents with   Chest Pain   Shortness of Breath    Tanner Mcgrath is a 52 y.o. male history of asthma, DVT/PE on Xarelto, hypertension, hyperlipidemia, OSA, GERD.  Patient presents to the ER today for evaluation of cough and shortness of breath.  Symptoms onset around 2 weeks ago, he reports that he believes the hot weather is what initiated his symptoms.  He reports a cough which is productive but he swallows the sputum so he does not know what color it is.  This is associated with a central chest pain which is constant worsens with exertion and with coughing, is described as sharp severe pain.  Pain does not radiate.  He reports occasionally he notices some left side pain as well when coughing.  He reports that he takes Xarelto for PE and DVT and does not believe he missed any doses.  He denies any extremity swelling or color change.  No recent fall or injury.  Denies fever or chills, abdominal pain or vomiting.  He denies any additional concerns today.  His wife is at bedside who corroborates history.  HPI     Home Medications Prior to Admission medications   Medication Sig Start Date End Date Taking? Authorizing Provider  albuterol (VENTOLIN HFA) 108 (90 Base) MCG/ACT inhaler Inhale 2 puffs into the lungs every 6 (six) hours as needed for wheezing. 06/17/21   [provider]  ARIPiprazole (ABILIFY) 5 MG tablet Take by mouth. 11/26/21   [provider]  atorvastatin (LIPITOR) 40 MG tablet Take 40 mg by mouth daily. 06/16/21   [provider]  azelastine (ASTELIN) 0.1 % nasal spray Place 1-2 sprays into both nostrils 2 (two) times daily as needed for allergies. 03/09/22   Lupita Leash, MD  cyclobenzaprine (FLEXERIL) 10 MG tablet Take 10 mg by mouth 3 (three) times daily. 07/11/21   [provider]  fluconazole (DIFLUCAN) 100 MG tablet Take 1 tablet (100 mg total) by mouth daily. 09/29/22   Mansouraty, Netty Starring., MD  fluticasone (FLONASE) 50 MCG/ACT nasal spray Place 2 sprays into both nostrils daily. 08/27/22   [provider]  fluticasone-salmeterol (ADVAIR) 100-50 MCG/ACT AEPB Inhale into the lungs. Patient not taking: Reported on 09/29/2022 11/30/21   [provider]  fluticasone-salmeterol (ADVAIR) 250-50 MCG/ACT AEPB Inhale 1 puff into the lungs every 12 (twelve) hours. 03/09/22   Lupita Leash, MD  hydrOXYzine (VISTARIL) 50 MG capsule Take 50 mg by mouth 4 (four) times daily as needed for anxiety or itching. Patient not taking: Reported on 08/31/2022 12/21/16   [provider]  JARDIANCE 25 MG TABS tablet Take 25 mg by mouth daily. 07/12/21   [provider]  melatonin 3 MG TABS tablet Take 2 tablets (6 mg total) by mouth at bedtime. 07/22/21   Lauro Franklin, MD  NOVOLOG FLEXPEN 100 UNIT/ML FlexPen Inject 0-12 Units into the skin 3 (three) times daily. 180-200=2; 201-250=5; 251-300=7; 301-350=10; OVER 350=12 05/06/21   [provider]  omeprazole (PRILOSEC) 40 MG capsule Take 40 mg by mouth daily.    [provider]  pregabalin (LYRICA) 150 MG capsule Take 150 mg by mouth 2 (two) times daily. 06/16/21   [provider]  propranolol ER (INDERAL LA) 80 MG 24 hr capsule Take 80 mg by mouth  daily. Patient not taking: Reported on 09/29/2022 08/27/22   [provider]  QUEtiapine Fumarate 150 MG TABS Take 150 mg by mouth at bedtime. 08/11/22   [provider]  rivaroxaban (XARELTO) 20 MG TABS tablet Take 20 mg by mouth daily with supper. Pt reports "not taking as clotting isn't that bad" but per outpatient notes this is still prescribed. Last filled 90 day supply on 06/29/21    [provider]  testosterone cypionate (DEPOTESTOSTERONE CYPIONATE) 200 MG/ML injection SMARTSIG:Milliliter(s) IM  09/16/22   [provider]  traZODone (DESYREL) 150 MG tablet Take 150-300 mg by mouth at bedtime as needed. 02/08/22   [provider]  TRELEGY ELLIPTA 100-62.5-25 MCG/ACT AEPB Inhale 1 puff into the lungs daily. 08/27/22   [provider]  TRUE METRIX BLOOD GLUCOSE TEST test strip  07/30/22   [provider]  TRULICITY 1.5 MG/0.5ML SOPN Inject 1.5 mg into the skin once a week. 05/21/21   [provider]  venlafaxine XR (EFFEXOR-XR) 150 MG 24 hr capsule Take 150 mg by mouth daily.    [provider]  zafirlukast (ACCOLATE) 10 MG tablet Take 1 tablet (10 mg total) by mouth 2 (two) times daily. 03/09/22   Lupita Leash, MD      Allergies    Dextromethorphan-guaifenesin, Buspirone hcl, Ketamine, Ambien [zolpidem], and Morphine    Review of Systems   Review of Systems Ten systems are reviewed and are negative for acute change except as noted in the HPI  Physical Exam Updated Vital Signs BP (!) 140/104 (BP Location: Right Arm)   Pulse 95   Temp (!) 97.3 F (36.3 C) (Oral)   Resp 18   Ht 6' (1.829 m)   Wt 104.3 kg   SpO2 93%   BMI 31.19 kg/m  Physical Exam Constitutional:      General: He is not in acute distress.    Appearance: Normal appearance. He is well-developed. He is not ill-appearing or diaphoretic.  HENT:     Head: Normocephalic and atraumatic.  Eyes:     General: Vision grossly intact. Gaze aligned appropriately.     Pupils: Pupils are equal, round, and reactive to light.  Neck:     Trachea: Trachea and phonation normal.  Cardiovascular:     Rate and Rhythm: Normal rate and regular rhythm.     Heart sounds: Normal heart sounds.  Pulmonary:     Effort: Pulmonary effort is normal. No respiratory distress.     Breath sounds: Wheezing present.  Abdominal:     General: There is no distension.     Palpations: Abdomen is soft.     Tenderness: There is no abdominal tenderness. There is no guarding or rebound.   Musculoskeletal:        General: Normal range of motion.     Cervical back: Normal range of motion.     Right lower leg: No tenderness. No edema.     Left lower leg: No tenderness. No edema.  Skin:    General: Skin is warm and dry.  Neurological:     Mental Status: He is alert.     GCS: GCS eye subscore is 4. GCS verbal subscore is 5. GCS motor subscore is 6.     Comments: Speech is clear and goal oriented, follows commands Major Cranial nerves without deficit, no facial droop Moves extremities without ataxia, coordination intact  Psychiatric:        Behavior: Behavior normal.     ED  Results / Procedures / Treatments   Labs (all labs ordered are listed, but only abnormal results are displayed) Labs Reviewed  BASIC METABOLIC PANEL - Abnormal; Notable for the following components:      Result Value   Potassium 3.4 (*)    CO2 20 (*)    Glucose, Bld 191 (*)    All other components within normal limits  RESP PANEL BY RT-PCR (RSV, FLU A&B, COVID)  RVPGX2  CBC  BRAIN NATRIURETIC PEPTIDE  I-STAT VENOUS BLOOD GAS, ED  TROPONIN I (HIGH SENSITIVITY)    EKG EKG Interpretation Date/Time:  Sunday December 19 2022 13:21:40 EDT Ventricular Rate:  96 PR Interval:  160 QRS Duration:  90 QT Interval:  348 QTC Calculation: 439 R Axis:   74  Text Interpretation: Normal sinus rhythm T wave abnormality, consider inferior ischemia new T wave abnormality, consider anterolateral ischemia When compared with ECG of 04-Apr-2022 19:16, PREVIOUS ECG IS PRESENT Confirmed by Gwyneth Sprout ([id number]) on 12/19/2022 2:15:37 PM  Radiology DG Chest 2 View  Result Date: 12/19/2022 CLINICAL DATA:  Chest pain. EXAM: CHEST - 2 VIEW COMPARISON:  04/04/2022. FINDINGS: Clear lungs. Normal heart size and mediastinal contours. No pleural effusion or pneumothorax. Visualized bones and upper abdomen are unremarkable. IMPRESSION: No evidence of acute cardiopulmonary disease. Electronically Signed   By: Orvan Falconer M.D.   On: 12/19/2022 14:30    Procedures Procedures    Medications Ordered in ED Medications  ipratropium-albuterol (DUONEB) 0.5-2.5 (3) MG/3ML nebulizer solution 3 mL (3 mLs Nebulization Given 12/19/22 1507)  methylPREDNISolone sodium succinate (SOLU-MEDROL) 125 mg/2 mL injection 125 mg (125 mg Intravenous Given 12/19/22 1507)    ED Course/ Medical Decision Making/ A&P                             Medical Decision Making ANATOLE APOLLO is a 53 y.o. male history of asthma, DVT/PE on Xarelto, hypertension, hyperlipidemia, OSA, GERD.  Patient presents to the ER today for evaluation of cough and shortness of breath.  Symptoms onset around 2 weeks ago, he reports that he believes the hot weather is what initiated his symptoms.  He reports a cough which is productive but he swallows the sputum so he does not know what color it is.  This is associated with a central chest pain which is constant worsens with exertion and with coughing, is described as sharp severe pain.  Pain does not radiate.  He reports occasionally he notices some left side pain as well when coughing.  He reports that he takes Xarelto for PE and DVT and does not believe he missed any doses.  He denies any extremity swelling or color change.  No recent fall or injury.  Denies fever or chills, abdominal pain or vomiting.  He denies any additional concerns today.  His wife is at bedside who corroborates history. - I discussed with patient and his wife today, suspect patient is experiencing an exacerbation of his asthma/COPD however with his new chest pain and history of PE this was also considered.  Plan to obtain chest pain labs, EKG and chest x-ray along with CTA of the chest to rule out PE.  Symptoms ongoing for 2 weeks CT should also help evaluate for underlying PNA not seen on chest x-ray.  Will order DuoNeb along with Solu-Medrol for treatment of asthma exacerbation.  Patient is satting 92-94% on room air during our  conversation.  No need for  supplemental oxygen at this point but patient will need monitoring.     Amount and/or Complexity of Data Reviewed Labs: ordered.    Details: Initial troponin within normal limits CBC without anemia, leukocytosis or thrombocytopenia BMP without emergent electrolyte derangement, AKI or gap BNP, VBG and viral panel is pending. Radiology: ordered.    Details: I reviewed two-view chest x-ray, I do not appreciate any obvious PTX, PNA or effusion.  Please see radiology interpretation. ECG/medicine tests:     Details: I personally reviewed patient's twelve-lead EKG.  I do not appreciate any obvious acute ischemic changes.  EKG reviewed with Dr. Anitra Lauth.  Risk Prescription drug management. Risk Details: Plan of care discussed with patient and his wife they both state understanding and wished to proceed with above studies and treatment.  Care handoff given to Langston Masker, PA-C at shift change.  Plan will be to follow-up on pending studies and reassess patient.  Final disposition per oncoming team.    Note: Portions of this report may have been transcribed using voice recognition software. Every effort was made to ensure accuracy; however, inadvertent computerized transcription errors may still be present.        Final Clinical Impression(s) / ED Diagnoses Final diagnoses:  None    Rx / DC Orders ED Discharge Orders     None         Elizabeth Palau 12/19/22 1527    Gwyneth Sprout, MD 12/20/22 [phone]

## 2022-12-19 NOTE — Discharge Instructions (Addendum)
Continue current medications.  Return if any problems.   

## 2022-12-19 NOTE — ED Notes (Signed)
Patient transported to CT 

## 2022-12-19 NOTE — ED Notes (Signed)
Pt received dc papers and verbalized understanding of f/u care. Pt verbalized feeling better and ambulated with steady gait. Pt has safe ride home at this time with wife. Pt a&ox4,vss,nad.

## 2023-10-10 ENCOUNTER — Encounter: Payer: Self-pay | Admitting: Podiatry

## 2023-10-10 ENCOUNTER — Ambulatory Visit: Admitting: Podiatry

## 2023-10-10 ENCOUNTER — Ambulatory Visit (INDEPENDENT_AMBULATORY_CARE_PROVIDER_SITE_OTHER)

## 2023-10-10 DIAGNOSIS — L97512 Non-pressure chronic ulcer of other part of right foot with fat layer exposed: Secondary | ICD-10-CM

## 2023-10-10 DIAGNOSIS — E1140 Type 2 diabetes mellitus with diabetic neuropathy, unspecified: Secondary | ICD-10-CM | POA: Diagnosis not present

## 2023-10-10 DIAGNOSIS — L03115 Cellulitis of right lower limb: Secondary | ICD-10-CM | POA: Diagnosis not present

## 2023-10-10 MED ORDER — AMOXICILLIN-POT CLAVULANATE 875-125 MG PO TABS
1.0000 | ORAL_TABLET | Freq: Two times a day (BID) | ORAL | 0 refills | Status: AC
Start: 2023-10-10 — End: 2023-10-20

## 2023-10-10 MED ORDER — MUPIROCIN 2 % EX OINT
1.0000 | TOPICAL_OINTMENT | Freq: Two times a day (BID) | CUTANEOUS | 2 refills | Status: AC
Start: 2023-10-10 — End: ?

## 2023-10-10 NOTE — Progress Notes (Unsigned)
 Chief Complaint  Patient presents with   Diabetes    NP here today for diabetic foot exam and nail care with callous bilaterally. The callous on the right lateral side looks like it is ulcerating with thick scabbing and callous build up. Last A1c 7.2 they think. Takes Xarelto .    HPI: 53 y.o. male presenting today with partner over concern for ulceration to right subfifth metatarsal head region and significant preulcerative callus left subfifth metatarsal head.  Patient believes it has been present for over a month.  Endorses neuropathy.  Last A1c 7.2.  He denies signs of infection or other constitutional symptoms.  Past Medical History:  Diagnosis Date   Anxiety    Arthritis    Asthma    Bipolar affective disorder (HCC)    Clotting disorder (HCC)    DVT and PE on xarelto    Depression    Diabetes mellitus without complication (HCC)    DVT (deep venous thrombosis) (HCC)    ED (erectile dysfunction)    GERD (gastroesophageal reflux disease)    Hyperlipidemia    Hypertension    Insomnia    Low testosterone    Lumbar stenosis    Obese    Obstructive sleep apnea    Pulmonary embolism (HCC)    Tremor    Vitamin D deficiency    Past Surgical History:  Procedure Laterality Date   CHOLECYSTECTOMY     COLONOSCOPY     NASAL SINUS SURGERY     Allergies  Allergen Reactions   Dextromethorphan-Guaifenesin Anaphylaxis    Dextromethorphan specifically, causes "hard to breathe" and "Itching" Dextromethorphan specifically, causes "hard to breathe" and "Itching"    Buspirone Hcl Rash    rash   Ketamine     Other reaction(s): Other, Sweating (intolerance) Panic  Panic     Ambien [Zolpidem]    Morphine    Smoker?: non-smoker   PHYSICAL EXAM: There were no vitals filed for this visit.  General: The patient is alert and oriented x3 in no acute distress.  Dermatology: Skin is warm, dry and supple bilateral lower extremities. Interspaces are clear of maceration and debris.   Nail plates x 10 are thickened, elongated, dystrophic with yellow discoloration and subungual debris.  There is significant preulcerative callus present left subfifth metatarsal head    Wound 1:  Location: Right subfifth metatarsal head        Depth: Fat layer exposed        Wound Border: Hemorrhagic callus        Wound Base: Necrotic tissue        Drainage: Minimal       Odor?:  Musky        Surrounding Tissue: Mild periwound erythema        Infected?:  Musky odor present with some necrotic tissue, no frank purulence        Necrosis?:  Some necrotic tissue to wound bed        Pain?:  No        Tunneling: No       Dimensions (cm): Overlying hemorrhagic callus excisionally debrided, postdebridement measurements 1 x 1 x 0.3 cm.   Vascular: Pedal pulses are palpable bilaterally.  Cap refill intact to the digits.  Pedal hair growth absent.  No significant pedal edema.  Neurological: Light touch sensation decreased.  Protective sensation absent.  Musculoskeletal Exam: Prominent fifth met heads bilaterally.      No data to display  RADIOGRAPHIC EXAM: Right foot 3 views weightbearing 10/10/2023 Normal osseous mineralization.  No signs of osteolysis or acute bony destruction  ASSESSMENT / PLAN OF CARE: 1. Ulcer of right foot with fat layer exposed (HCC)   2. Cellulitis of right foot   3. Type 2 diabetes mellitus with diabetic neuropathy, unspecified whether long term insulin  use (HCC)      Meds ordered this encounter  Medications   amoxicillin-clavulanate (AUGMENTIN) 875-125 MG tablet    Sig: Take 1 tablet by mouth 2 (two) times daily for 10 days.    Dispense:  20 tablet    Refill:  0   mupirocin ointment (BACTROBAN) 2 %    Sig: Apply 1 Application topically 2 (two) times daily.    Dispense:  30 g    Refill:  2   VAS US  ABI WITH/WO TBI baseline ABIs ordered today  Radiographs reviewed with patient  Culture swabs of the wound was obtained today.  The ulceration  was sharply debrided of hyperkeratotic and devitalized soft tissue with sterile #312 blade, tissue nipper to the level of subcutaneous tissue.  Hemostasis obtained using silver nitrate, patient is on Xarelto .  Antibiotic ointment and DSD applied.  Reviewed off-loading with patient.  Surgical shoe dispensed for patient to wear at all times WB.  Offloading dancers pad applied to both feet to offload fifth metatarsals.  Reviewed daily dressing changes with patient.  Starting empiric antibiotics out of abundance of caution due to some odor with debridement.  Discussed risks / concerns regarding ulcer with patient and possible sequelae if left untreated.  Stressed importance of infection prevention at home. Short-term goals are: prevent infection, off-load ulcer, heal ulcer Long-term goals are:  prevent recurrence, prevent amputation.   Return in about 2 weeks (around 10/24/2023) for Wound Care/nail trim.   Reginald Mangels L. Lunda Salines, AACFAS [facility]     [address] [place], [place] [address]                Office [phone]  Fax [phone]

## 2023-10-19 ENCOUNTER — Ambulatory Visit: Payer: Self-pay | Admitting: Podiatry

## 2023-10-25 ENCOUNTER — Ambulatory Visit: Admitting: Podiatry

## 2023-10-25 ENCOUNTER — Encounter: Payer: Self-pay | Admitting: Podiatry

## 2023-10-25 DIAGNOSIS — M79674 Pain in right toe(s): Secondary | ICD-10-CM | POA: Diagnosis not present

## 2023-10-25 DIAGNOSIS — L97512 Non-pressure chronic ulcer of other part of right foot with fat layer exposed: Secondary | ICD-10-CM | POA: Diagnosis not present

## 2023-10-25 DIAGNOSIS — B351 Tinea unguium: Secondary | ICD-10-CM

## 2023-10-25 DIAGNOSIS — E1140 Type 2 diabetes mellitus with diabetic neuropathy, unspecified: Secondary | ICD-10-CM

## 2023-10-25 DIAGNOSIS — M216X1 Other acquired deformities of right foot: Secondary | ICD-10-CM | POA: Diagnosis not present

## 2023-10-25 DIAGNOSIS — M216X2 Other acquired deformities of left foot: Secondary | ICD-10-CM | POA: Diagnosis not present

## 2023-10-25 DIAGNOSIS — M79675 Pain in left toe(s): Secondary | ICD-10-CM | POA: Diagnosis not present

## 2023-10-25 NOTE — Progress Notes (Signed)
 Chief Complaint  Patient presents with   Wound Check    Ulcer check of the right foot, it is calloused over, and has an open center. He also needs [facility] today. Last A1c is unknown. No FBS today. Takes Xarelto     HPI: 53 y.o. male presenting today with partner for follow-up evaluation of right subfifth metatarsal head ulceration and for treatment of painful mycotic, dystrophic toenails.  Patient has difficulty trimming them himself due to their dystrophic nature.  He is on Xarelto . Endorses neuropathy.  Last A1c 7.2.  He has completed course of oral Augmentin .  Past Medical History:  Diagnosis Date   Anxiety    Arthritis    Asthma    Bipolar affective disorder (HCC)    Clotting disorder (HCC)    DVT and PE on xarelto    Depression    Diabetes mellitus without complication (HCC)    DVT (deep venous thrombosis) (HCC)    ED (erectile dysfunction)    GERD (gastroesophageal reflux disease)    Hyperlipidemia    Hypertension    Insomnia    Low testosterone    Lumbar stenosis    Obese    Obstructive sleep apnea    Pulmonary embolism (HCC)    Tremor    Vitamin D deficiency    Past Surgical History:  Procedure Laterality Date   CHOLECYSTECTOMY     COLONOSCOPY     NASAL SINUS SURGERY     Allergies  Allergen Reactions   Dextromethorphan-Guaifenesin Anaphylaxis    Dextromethorphan specifically, causes "hard to breathe" and "Itching" Dextromethorphan specifically, causes "hard to breathe" and "Itching"    Buspirone Hcl Rash    rash   Ketamine     Other reaction(s): Other, Sweating (intolerance) Panic  Panic     Ambien [Zolpidem]    Morphine    Smoker?: non-smoker   PHYSICAL EXAM: There were no vitals filed for this visit.  General: The patient is alert and oriented x3 in no acute distress.  Dermatology: Skin is warm, dry and supple bilateral lower extremities. Interspaces are clear of maceration and debris.  Nail plates x 10 are thickened, elongated, dystrophic with  yellow discoloration and subungual debris.  They are tender on direct dorsal palpation.  There is significant preulcerative callus present left subfifth metatarsal head without signs of hematoma or deep skin breakdown.    Wound 1:  Location: Right subfifth metatarsal head        Depth: Fat layer exposed        Wound Border: Hemorrhagic callus        Wound Base: Granulation tissue with some friable fibrous slough overlying        Drainage: Minimal       Odor?:  Minimal        Surrounding Tissue: Stable appearing, periwound callus        Infected?:  No        Necrosis?:  Some fibrous slough present to wound bed        Pain?:  No        Tunneling: No       Dimensions (cm): Ulceration and surrounding hemorrhagic callus were sharply debrided today.  Postdebridement measurements 0.5 x 0.5 x 0.3 cm in diameter with fat layer exposed centrally.   Vascular: Pedal pulses are palpable bilaterally.  Cap refill intact to the digits.  Pedal hair growth absent.  No significant pedal edema.  Neurological: Light touch sensation decreased.  Protective sensation absent.  Musculoskeletal Exam: Prominent  fifth met heads bilaterally.      No data to display           RADIOGRAPHIC EXAM: Right foot 3 views weightbearing 10/10/2023 Normal osseous mineralization.  No signs of osteolysis or acute bony destruction  ASSESSMENT / PLAN OF CARE: 1. Prominent metatarsal head of right foot   2. Prominent metatarsal head of left foot   3. Type 2 diabetes mellitus with diabetic neuropathy, unspecified whether long term insulin  use (HCC)   4. Ulcer of right foot with fat layer exposed (HCC)   5. Pain due to onychomycosis of toenails of both feet      No orders of the defined types were placed in this encounter.  None   # Right subfifth metatarsal head ulceration Previously ordered ABIs, have not been scheduled at this point in time.  Patient has completed course of oral Augmentin , no signs of cellulitis  today.  The ulceration was sharply debrided of hyperkeratotic and devitalized soft tissue with sterile #312 blade, tissue nipper to the level of subcutaneous tissue.  Hemostasis obtained using silver nitrate, patient is on Xarelto .  Antibiotic ointment and DSD applied.  Reviewed off-loading with patient.  Felt dancers pad applied.  Continue strict offloading of bilateral fifth metatarsals  Reviewed daily dressing changes with patient.  Discussed risks / concerns regarding ulcer with patient and possible sequelae if left untreated.  Stressed importance of infection prevention at home. Short-term goals are: prevent infection, off-load ulcer, heal ulcer Long-term goals are:  prevent recurrence, prevent amputation.   # Diabetes with neuropathy # Plantarflexed fifth metatarsal heads bilaterally -Associated preulcerative callus left subfifth metatarsal head, diabetic ulceration right subfifth metatarsal head -Patient educated on diabetes. Discussed proper diabetic foot care and discussed risks and complications of disease. Educated patient in depth on reasons to return to the office immediately should he/she discover anything concerning or new on the feet. All questions answered. Discussed proper shoes as well.  - Has had good wound healing to right foot, does get significant amount of hyperkeratotic buildup to both feet.  Would benefit from diabetic shoes with 3 sets of multidensity inserts to offload these areas and prevent ulceration. - Prescription sent in for custom diabetic shoes with 3 sets of multidensity inserts.  Referral sent into [facility]. -Strict offloading with felted foam dancers pads discussed at length with patient in the interim -I certify that this diagnosis represents a distinct and separate diagnosis that requires evaluation and treatment separate from other procedures or diagnosis    # Pain due to onychomycosis of toenails - Nails x 10 were debrided in thickness and  length using sterile nail nippers without incident.    Return in about 2 weeks (around 11/08/2023) for Wound Care.   Tanner Mcgrath, AACFAS [facility]     [address] [place], [place] [address]                Office [phone]  Fax [phone]

## 2023-11-07 ENCOUNTER — Ambulatory Visit: Admitting: Podiatry

## 2023-12-13 ENCOUNTER — Encounter: Payer: Self-pay | Admitting: Podiatry

## 2023-12-13 ENCOUNTER — Ambulatory Visit: Admitting: Podiatry

## 2023-12-13 DIAGNOSIS — M216X2 Other acquired deformities of left foot: Secondary | ICD-10-CM

## 2023-12-13 DIAGNOSIS — M216X1 Other acquired deformities of right foot: Secondary | ICD-10-CM

## 2023-12-13 DIAGNOSIS — E1140 Type 2 diabetes mellitus with diabetic neuropathy, unspecified: Secondary | ICD-10-CM

## 2023-12-13 NOTE — Progress Notes (Unsigned)
 Chief Complaint  Patient presents with   Foot Ulcer    2 week follow up right foot    HPI: 53 y.o. male presenting today with partner for follow-up evaluation of right subfifth metatarsal head ulceration.  He is on Xarelto . Endorses neuropathy.  He believes that the ulceration site is doing pretty well.  Does have significant hyperkeratotic tissue left subfifth metatarsal head as well appears at risk of ulceration.  Does state that he is having fitting appointment with [facility] soon for his diabetic shoes.  Past Medical History:  Diagnosis Date   Anxiety    Arthritis    Asthma    Bipolar affective disorder (HCC)    Clotting disorder (HCC)    DVT and PE on xarelto    Depression    Diabetes mellitus without complication (HCC)    DVT (deep venous thrombosis) (HCC)    ED (erectile dysfunction)    GERD (gastroesophageal reflux disease)    Hyperlipidemia    Hypertension    Insomnia    Low testosterone    Lumbar stenosis    Obese    Obstructive sleep apnea    Pulmonary embolism (HCC)    Tremor    Vitamin D deficiency    Past Surgical History:  Procedure Laterality Date   CHOLECYSTECTOMY     COLONOSCOPY     NASAL SINUS SURGERY     Allergies  Allergen Reactions   Dextromethorphan-Guaifenesin Anaphylaxis    Dextromethorphan specifically, causes hard to breathe and Itching Dextromethorphan specifically, causes hard to breathe and Itching    Buspirone Hcl Rash    rash   Ketamine     Other reaction(s): Other, Sweating (intolerance) Panic  Panic     Ambien [Zolpidem]    Morphine    Smoker?: non-smoker   PHYSICAL EXAM: There were no vitals filed for this visit.  General: The patient is alert and oriented x3 in no acute distress.  Dermatology: Nailplates x 10 mycotic in appearance Right subfifth ulceration site appears nearly healed today with some overlying hemorrhagic callus.  Following debridement of this there is very focal area of partial-thickness  skin breakdown.  There is significant hyperkeratotic preulcerative callus left subfifth metatarsal head as well with area of dried heme present  Vascular: Pedal pulses are palpable bilaterally.  Cap refill intact to the digits.  Pedal hair growth absent.  No significant pedal edema.  Neurological: Light touch sensation decreased.  Protective sensation absent.  Musculoskeletal Exam: Prominent fifth met heads bilaterally.      No data to display           ASSESSMENT / PLAN OF CARE: 1. Prominent metatarsal head of right foot   2. Prominent metatarsal head of left foot   3. Type 2 diabetes mellitus with diabetic neuropathy, unspecified whether long term insulin  use (HCC)       No orders of the defined types were placed in this encounter.  None   # Plantarflexed fifth metatarsals bilaterally with preulcerative callus x 2 All symptomatic hyperkeratoses x 2 were safely debrided as a courtesy with a sterile #312 blade to patient's level of comfort without incident. We discussed preventative and palliative care of these lesions including supportive and accommodative shoegear, padding, prefabricated and custom molded accommodative orthoses, use of a pumice stone and lotions/creams daily. - Is awaiting diabetic shoes - Does have focal area of partial-thickness skin breakdown, monitor this closely, dressed with mupirocin  and a bandage today. - Monitor for signs of recurrent ulceration.  Return in about 6 weeks (around 01/24/2024) for Diabetic Foot Care.   Eleen Litz L. Lamount MAUL, AACFAS [facility]     [address] [place], [place] [address]                Office [phone]  Fax [phone]

## 2023-12-28 ENCOUNTER — Ambulatory Visit (HOSPITAL_COMMUNITY)
Admission: RE | Admit: 2023-12-28 | Discharge: 2023-12-28 | Disposition: A | Discharge status: Home / Self Care | Source: Ambulatory Visit | Attending: Podiatry | Admitting: Podiatry

## 2023-12-28 DIAGNOSIS — L97512 Non-pressure chronic ulcer of other part of right foot with fat layer exposed: Secondary | ICD-10-CM | POA: Diagnosis present

## 2023-12-28 LAB — VAS US ABI WITH/WO TBI
Left ABI: 1.03
Right ABI: 1.01

## 2023-12-29 ENCOUNTER — Encounter (HOSPITAL_COMMUNITY)

## 2024-01-24 ENCOUNTER — Encounter: Payer: Self-pay | Admitting: Podiatry

## 2024-01-24 ENCOUNTER — Ambulatory Visit (INDEPENDENT_AMBULATORY_CARE_PROVIDER_SITE_OTHER): Admitting: Podiatry

## 2024-01-24 DIAGNOSIS — M79674 Pain in right toe(s): Secondary | ICD-10-CM | POA: Diagnosis not present

## 2024-01-24 DIAGNOSIS — L600 Ingrowing nail: Secondary | ICD-10-CM | POA: Diagnosis not present

## 2024-01-24 DIAGNOSIS — L97512 Non-pressure chronic ulcer of other part of right foot with fat layer exposed: Secondary | ICD-10-CM | POA: Diagnosis not present

## 2024-01-24 DIAGNOSIS — M79675 Pain in left toe(s): Secondary | ICD-10-CM

## 2024-01-24 DIAGNOSIS — E1140 Type 2 diabetes mellitus with diabetic neuropathy, unspecified: Secondary | ICD-10-CM | POA: Diagnosis not present

## 2024-01-24 DIAGNOSIS — L84 Corns and callosities: Secondary | ICD-10-CM

## 2024-01-24 DIAGNOSIS — M216X1 Other acquired deformities of right foot: Secondary | ICD-10-CM

## 2024-01-24 DIAGNOSIS — B351 Tinea unguium: Secondary | ICD-10-CM

## 2024-01-24 MED ORDER — DOXYCYCLINE HYCLATE 100 MG PO CAPS: 100.0000 mg | ORAL_CAPSULE | Freq: Two times a day (BID) | ORAL | 0 refills | Status: AC

## 2024-01-24 MED ORDER — SILVER SULFADIAZINE 1 % EX CREA
1.0000 | TOPICAL_CREAM | Freq: Every day | CUTANEOUS | 0 refills | Status: AC
Start: 2024-01-24 — End: ?

## 2024-01-24 NOTE — Progress Notes (Unsigned)
 No chief complaint on file.   HPI: 53 y.o. male presenting today for diabetic footcare.  He is awaiting diabetic shoes.  He has noticed significant calluses where he previously had ulceration on the right side.  Also has noticed redness and swelling of the right first toe.  Denies any pain or other signs and symptoms of infection.  Also presenting with painful, thickened, long great toenails that are dystrophic and difficult for patient to maintain.  Past Medical History:  Diagnosis Date   Anxiety    Arthritis    Asthma    Bipolar affective disorder (HCC)    Clotting disorder (HCC)    DVT and PE on xarelto    Depression    Diabetes mellitus without complication (HCC)    DVT (deep venous thrombosis) (HCC)    ED (erectile dysfunction)    GERD (gastroesophageal reflux disease)    Hyperlipidemia    Hypertension    Insomnia    Low testosterone    Lumbar stenosis    Obese    Obstructive sleep apnea    Pulmonary embolism (HCC)    Tremor    Vitamin D deficiency    Past Surgical History:  Procedure Laterality Date   CHOLECYSTECTOMY     COLONOSCOPY     NASAL SINUS SURGERY     Allergies  Allergen Reactions   Dextromethorphan-Guaifenesin Anaphylaxis    Dextromethorphan specifically, causes hard to breathe and Itching Dextromethorphan specifically, causes hard to breathe and Itching    Buspirone Hcl Rash    rash   Ketamine     Other reaction(s): Other, Sweating (intolerance) Panic  Panic     Ambien [Zolpidem]    Morphine    Smoker?: non-smoker   PHYSICAL EXAM: There were no vitals filed for this visit.  General: The patient is alert and oriented x3 in no acute distress.  Dermatology: Nailplates x 10 mycotic in appearance.  They are painful, thickened, elongated dystrophic with subungual debris.  Right hallux lateral border is erythematous and edematous.  Some incurvation of the nail plate here. Significant hemorrhagic callus present subfifth metatarsal head  bilaterally.  Recurrence of ulceration right side measuring 0.5 x 0.5 x 0.3 cm with fat layer exposed.  No signs of infection.  Pedal skin atrophic  Vascular: Pedal pulses are palpable bilaterally.  Cap refill intact to the digits.  Pedal hair growth absent.  No significant pedal edema.  Neurological: Light touch sensation decreased.  Protective sensation absent.  Musculoskeletal Exam: Prominent fifth met heads bilaterally.      No data to display           ASSESSMENT / PLAN OF CARE: 1. Ulcer of right foot with fat layer exposed (HCC)   2. Type 2 diabetes mellitus with diabetic neuropathy, unspecified whether long term insulin  use (HCC)   3. Pain due to onychomycosis of toenails of both feet   4. Ingrown toenail of right foot with infection   5. Callus of foot       Meds ordered this encounter  Medications   silver  sulfADIAZINE  (SILVADENE ) 1 % cream    Sig: Apply 1 Application topically daily.    Dispense:  50 g    Refill:  0   doxycycline  (VIBRAMYCIN ) 100 MG capsule    Sig: Take 1 capsule (100 mg total) by mouth 2 (two) times daily for 7 days.    Dispense:  14 capsule    Refill:  0   None   # Right subfifth metatarsal head  ulceration - Medically necessary excisional debridement was performed using 312 scalpel blade of all nonviable skin subcutaneous tissue.  Hemostasis achieved with compression.  No anesthesia necessary due to neuropathy. - Antibiotic bandage applied.  Reviewed daily dressing changes with patient. - Silvadene  cream sent to patient's pharmacy - Reviewed offloading with patient, dancers pads applied to bilateral fifth metatarsal heads  # Preulcerative callus left subfifth metatarsal head Symptomatic hyperkeratotic tissue was safely debrided with a sterile #312 blade to patient's level of comfort without incident. We discussed preventative and palliative care of these lesions including supportive and accommodative shoegear, padding, prefabricated and custom  molded accommodative orthoses, use of a pumice stone and lotions/creams daily.  # Ingrown right first toe lateral border with cellulitis - Slant back nail trim performed, all affected toenail was removed, no drainage noted - Started patient on course of oral doxycycline  100 mg twice daily. -I certify that this diagnosis represents a distinct and separate diagnosis that requires evaluation and treatment separate from other procedures or diagnosis   #Onychomycosis with pain  -Nails palliatively debrided as below. -Educated on self-care  Procedure: Nail Debridement Rationale: Pain Type of Debridement: manual, sharp debridement. Instrumentation: Nail nipper, rotary burr. Number of Nails: 10  Patient educated on diabetes. Discussed proper diabetic foot care and discussed risks and complications of disease. Educated patient in depth on reasons to return to the office immediately should he/she discover anything concerning or new on the feet. All questions answered. Discussed proper shoes as well.       Return in about 2 weeks (around 02/07/2024) for Wound Care.   Burney Calzadilla L. Lamount MAUL, AACFAS [facility]     [address] [place], [place] [address]                Office [phone]  Fax [phone]

## 2024-02-14 ENCOUNTER — Ambulatory Visit: Admitting: Podiatry

## 2024-02-14 DIAGNOSIS — L97512 Non-pressure chronic ulcer of other part of right foot with fat layer exposed: Secondary | ICD-10-CM

## 2024-02-14 DIAGNOSIS — L600 Ingrowing nail: Secondary | ICD-10-CM

## 2024-02-14 DIAGNOSIS — E1140 Type 2 diabetes mellitus with diabetic neuropathy, unspecified: Secondary | ICD-10-CM | POA: Diagnosis not present

## 2024-02-14 NOTE — Progress Notes (Unsigned)
 Chief Complaint  Patient presents with   Wound Check    R 5th lateral wound is healing and having minimal pain.  Finished antibiotics and applying cream 1 x day. Diabetic A1c 7.9  xeralto     HPI: 53 y.o. male presents today following up for right subfifth met head ulceration and for right first toe cellulitis from ingrown.  He reports doing well.  He completed course of oral doxycycline .  Denies any problems today.  Believes the ulcer may be healed.  Past Medical History:  Diagnosis Date   Anxiety    Arthritis    Asthma    Bipolar affective disorder (HCC)    Clotting disorder (HCC)    DVT and PE on xarelto    Depression    Diabetes mellitus without complication (HCC)    DVT (deep venous thrombosis) (HCC)    ED (erectile dysfunction)    GERD (gastroesophageal reflux disease)    Hyperlipidemia    Hypertension    Insomnia    Low testosterone    Lumbar stenosis    Obese    Obstructive sleep apnea    Pulmonary embolism (HCC)    Tremor    Vitamin D deficiency     Past Surgical History:  Procedure Laterality Date   CHOLECYSTECTOMY     COLONOSCOPY     NASAL SINUS SURGERY      Allergies  Allergen Reactions   Dextromethorphan-Guaifenesin Anaphylaxis    Dextromethorphan specifically, causes hard to breathe and Itching Dextromethorphan specifically, causes hard to breathe and Itching    Buspirone Hcl Rash    rash   Ketamine     Other reaction(s): Other, Sweating (intolerance) Panic  Panic     Ambien [Zolpidem]    Morphine     ROS    Physical Exam: There were no vitals filed for this visit.  General: The patient is alert and oriented x3 in no acute distress.  Dermatology: Right subfifth metatarsal head ulceration appears well-healed today.  There is some overlying callus without underlying skin breakdown.  Right first toe cellulitis appears resolved.  No drainage noted.  Vascular: Pedal pulses are palpable bilaterally. Cap refill intact to the  digits. Pedal hair growth absent. No significant pedal edema.   Neurological: Light touch sensation decreased to toes.  Protective sensation decreased.  Musculoskeletal Exam: Prominent fifth metatarsal heads bilaterally.  Assessment/Plan of Care: 1. Type 2 diabetes mellitus with diabetic neuropathy, unspecified whether long term insulin  use (HCC)   2. Ulcer of right foot with fat layer exposed (HCC)   3. Ingrown toenail of right foot with infection      No orders of the defined types were placed in this encounter.  None  Discussed clinical findings with patient today.  Right subfifth ulceration appears well-healed.  Continue to offload the bilateral subfifth metatarsal heads where he is prone to developing preulcerative callus.  Right first toe slant back nail trim appears well-healed with no signs of cellulitis today.  Completed course of doxycycline  without incident.  Will have patient follow-up in about 10 weeks for diabetic footcare.   Tywanna Seifer L. Lamount MAUL, AACFAS [facility]     [address], [place] [address]  Office [phone]  Fax [phone]   Well healed right sub 5th No cellulitis to toe

## 2024-02-17 ENCOUNTER — Encounter: Payer: Self-pay | Admitting: Podiatry

## 2024-02-27 ENCOUNTER — Ambulatory Visit: Admitting: Podiatry

## 2024-03-12 ENCOUNTER — Ambulatory Visit (INDEPENDENT_AMBULATORY_CARE_PROVIDER_SITE_OTHER): Admitting: Podiatry

## 2024-03-12 ENCOUNTER — Encounter: Payer: Self-pay | Admitting: Podiatry

## 2024-03-12 DIAGNOSIS — M216X2 Other acquired deformities of left foot: Secondary | ICD-10-CM | POA: Diagnosis not present

## 2024-03-12 DIAGNOSIS — E1140 Type 2 diabetes mellitus with diabetic neuropathy, unspecified: Secondary | ICD-10-CM

## 2024-03-12 DIAGNOSIS — M216X1 Other acquired deformities of right foot: Secondary | ICD-10-CM | POA: Diagnosis not present

## 2024-03-12 NOTE — Progress Notes (Signed)
 Chief Complaint  Patient presents with   Wound Check    Bilateral callous, laterally at Sub-met 5. Both are closed.  A1c 9 in July No anti coag.     HPI: 53 y.o. male presents today for recheck of subfifth metatarsal head ulceration.  He does get significant callus buildup underneath the fifth metatarsal head of both feet.  Has been using diabetic shoes.  He is uncertain how much it has been helping with preventing the callus buildup.  Last A1c was 9 in July.  This is increased from previous.  Past Medical History:  Diagnosis Date   Anxiety    Arthritis    Asthma    Bipolar affective disorder (HCC)    Clotting disorder    DVT and PE on xarelto    Depression    Diabetes mellitus without complication (HCC)    DVT (deep venous thrombosis) (HCC)    ED (erectile dysfunction)    GERD (gastroesophageal reflux disease)    Hyperlipidemia    Hypertension    Insomnia    Low testosterone    Lumbar stenosis    Obese    Obstructive sleep apnea    Pulmonary embolism (HCC)    Tremor    Vitamin D deficiency     Past Surgical History:  Procedure Laterality Date   CHOLECYSTECTOMY     COLONOSCOPY     NASAL SINUS SURGERY      Allergies  Allergen Reactions   Dextromethorphan-Guaifenesin Anaphylaxis    Dextromethorphan specifically, causes hard to breathe and Itching Dextromethorphan specifically, causes hard to breathe and Itching    Buspirone Hcl Rash    rash   Ketamine     Other reaction(s): Other, Sweating (intolerance) Panic  Panic     Ambien [Zolpidem]    Morphine     ROS    Physical Exam: There were no vitals filed for this visit.  General: The patient is alert and oriented x3 in no acute distress.  Dermatology: No recurrence for right subfifth metatarsal ulceration.  There is some hemorrhagic callus present bilateral subfifth metatarsal heads but no significant skin breakdown.  Vascular: Palpable pedal pulses bilaterally. Capillary refill within  normal limits.  No appreciable edema.  No erythema or calor.  Neurological: Light touch sensation decreased to the toes.  Protective sensation decreased.  Musculoskeletal Exam: Prominent fifth metatarsal heads with tailor's bunion and fat pad atrophy bilaterally.   Assessment/Plan of Care: 1. Type 2 diabetes mellitus with diabetic neuropathy, unspecified whether long term insulin  use (HCC)   2. Prominent metatarsal head of right foot   3. Prominent metatarsal head of left foot      No orders of the defined types were placed in this encounter.  None  Discussed clinical findings with patient today.  Today did debride the the sub 5th metatarsal head calluses bilaterally as a courtesy using 312 scalpel blade.  Continue to monitor the sites closely for signs of recurrent infection.  May benefit from continued offloading using reverse dancers pads along with diabetic shoes.  Will see patient back in about 6 weeks when he is due for his next diabetic footcare.  Advised for him to follow-up as needed in the meantime if he notices signs of recurrent infection or ulceration or has any concerns surrounding this.  At increased risk of this due to the diabetic neuropathy and pedal deformity with plantarflexed and prominent fifth metatarsal heads bilaterally.  Continue use of urea 40% cream to help  limit hyperkeratotic buildup.   Willies Laviolette L. Lamount MAUL, AACFAS [facility]     [address] [place], [place] [address]                Office [phone]  Fax [phone]

## 2024-04-23 ENCOUNTER — Ambulatory Visit (INDEPENDENT_AMBULATORY_CARE_PROVIDER_SITE_OTHER): Admitting: Podiatry

## 2024-04-23 DIAGNOSIS — L84 Corns and callosities: Secondary | ICD-10-CM

## 2024-04-23 DIAGNOSIS — B351 Tinea unguium: Secondary | ICD-10-CM | POA: Diagnosis not present

## 2024-04-23 DIAGNOSIS — M79674 Pain in right toe(s): Secondary | ICD-10-CM

## 2024-04-23 DIAGNOSIS — E1140 Type 2 diabetes mellitus with diabetic neuropathy, unspecified: Secondary | ICD-10-CM | POA: Diagnosis not present

## 2024-04-23 DIAGNOSIS — M79675 Pain in left toe(s): Secondary | ICD-10-CM | POA: Diagnosis not present

## 2024-04-23 NOTE — Progress Notes (Unsigned)
  Subjective:  Patient ID: Tanner Mcgrath Mon, male    DOB: 1970/06/17,  MRN: [id number]  Chief Complaint  Patient presents with   Nail Problem    Pt is here for a nail trim     53 y.o. male presents with the above complaint. History confirmed with patient. Patient presenting with pain related to dystrophic thickened elongated nails. Patient is unable to trim own nails related to nail dystrophy. Patient does have a history of T2DM with neuropathy.  Last A1c 8.1. Patient does have callus present located at the bilateral subfifth metatarsal head causing pain.  History of ulceration right subfifth metatarsal head.  He has been using diabetic shoes he does think that this helps with the callus formation though it is still significant.  Objective:  Physical Exam: warm, good capillary refill, decreased pedal hair growth, pedal skin atrophic nail exam onychomycosis of the toenails, onycholysis, and dystrophic nails greater than 3 mm thickening DP pulses palpable, PT pulses palpable, protective sensation absent, and vibratory sensation diminished, subjective paresthesias Left Foot:  Pain with palpation of nails due to elongation and dystrophic growth.  Significant preulcerative callus subfifth metatarsal head Right Foot: Pain with palpation of nails due to elongation and dystrophic growth. Significant preulcerative callus subfifth metatarsal head  Assessment:   1. Type 2 diabetes mellitus with diabetic neuropathy, unspecified whether long term insulin  use (HCC)   2. Callus of foot   3. Pain due to onychomycosis of toenails of both feet      Plan:  Patient was evaluated and treated and all questions answered.  #Hyperkeratotic lesions/pre ulcerative calluses present bilateral subfifth metatarsal head All symptomatic hyperkeratoses x 2 separate lesions were safely debrided with a sterile #312 blade to patient's level of comfort without incident. We discussed preventative and palliative care of these  lesions including supportive and accommodative shoegear, padding, prefabricated and custom molded accommodative orthoses, use of a pumice stone and lotions/creams daily. - Prior ulceration right subfifth metatarsal head appears healed  #Onychomycosis with pain  -Nails palliatively debrided as below. -Educated on self-care  Procedure: Nail Debridement Rationale: Pain Type of Debridement: manual, sharp debridement. Instrumentation: Nail nipper, rotary burr. Number of Nails: 10  # Diabetes with neuropathy Patient educated on diabetes. Discussed proper diabetic foot care and discussed risks and complications of disease. Educated patient in depth on reasons to return to the office immediately should he/she discover anything concerning or new on the feet. All questions answered. Discussed proper shoes as well.    Return in about 3 months (around 07/24/2024) for Diabetic Foot Care.         Ethan Saddler, DPM [facility] / [facility]

## 2024-04-23 NOTE — Patient Instructions (Signed)
 Look for urea 40% cream or ointment and apply to the thickened dry skin / calluses. This can be bought over the counter, at a pharmacy or online such as [employer].

## 2024-04-26 ENCOUNTER — Encounter: Payer: Self-pay | Admitting: Podiatry

## 2024-07-24 ENCOUNTER — Ambulatory Visit: Admitting: Podiatry
